# Patient Record
Sex: Male | Born: 1950
Health system: Southern US, Community
[De-identification: ages and names within clinical notes are randomized; demographics above are authoritative.]

## PROBLEM LIST (undated history)

## (undated) DIAGNOSIS — I700 Atherosclerosis of aorta: Secondary | ICD-10-CM

## (undated) DIAGNOSIS — J45909 Unspecified asthma, uncomplicated: Secondary | ICD-10-CM

## (undated) DIAGNOSIS — Z86010 Personal history of colonic polyps: Secondary | ICD-10-CM

## (undated) DIAGNOSIS — H269 Unspecified cataract: Secondary | ICD-10-CM

## (undated) DIAGNOSIS — F32A Depression, unspecified: Secondary | ICD-10-CM

## (undated) DIAGNOSIS — I10 Essential (primary) hypertension: Secondary | ICD-10-CM

## (undated) DIAGNOSIS — E785 Hyperlipidemia, unspecified: Secondary | ICD-10-CM

## (undated) DIAGNOSIS — I2510 Atherosclerotic heart disease of native coronary artery without angina pectoris: Secondary | ICD-10-CM

## (undated) DIAGNOSIS — R7303 Prediabetes: Secondary | ICD-10-CM

## (undated) DIAGNOSIS — M1990 Unspecified osteoarthritis, unspecified site: Secondary | ICD-10-CM

## (undated) DIAGNOSIS — F419 Anxiety disorder, unspecified: Secondary | ICD-10-CM

## (undated) DIAGNOSIS — F1910 Other psychoactive substance abuse, uncomplicated: Secondary | ICD-10-CM

## (undated) DIAGNOSIS — I714 Abdominal aortic aneurysm, without rupture, unspecified: Secondary | ICD-10-CM

## (undated) DIAGNOSIS — L509 Urticaria, unspecified: Secondary | ICD-10-CM

## (undated) DIAGNOSIS — F329 Major depressive disorder, single episode, unspecified: Secondary | ICD-10-CM

## (undated) HISTORY — DX: Abdominal aortic aneurysm, without rupture, unspecified: I71.40

## (undated) HISTORY — PX: COLONOSCOPY: SHX174

## (undated) HISTORY — DX: Prediabetes: R73.03

## (undated) HISTORY — DX: Major depressive disorder, single episode, unspecified: F32.9

## (undated) HISTORY — DX: Personal history of colonic polyps: Z86.010

## (undated) HISTORY — PX: EYE SURGERY: SHX253

## (undated) HISTORY — DX: Unspecified asthma, uncomplicated: J45.909

## (undated) HISTORY — DX: Urticaria, unspecified: L50.9

## (undated) HISTORY — DX: Hyperlipidemia, unspecified: E78.5

## (undated) HISTORY — DX: Depression, unspecified: F32.A

## (undated) HISTORY — DX: Unspecified cataract: H26.9

## (undated) HISTORY — PX: POLYPECTOMY: SHX149

## (undated) HISTORY — DX: Essential (primary) hypertension: I10

## (undated) HISTORY — DX: Atherosclerosis of aorta: I70.0

## (undated) HISTORY — DX: Atherosclerotic heart disease of native coronary artery without angina pectoris: I25.10

## (undated) HISTORY — DX: Anxiety disorder, unspecified: F41.9

## (undated) HISTORY — DX: Unspecified osteoarthritis, unspecified site: M19.90

## (undated) HISTORY — DX: Other psychoactive substance abuse, uncomplicated: F19.10

---

## 1999-07-10 ENCOUNTER — Ambulatory Visit (HOSPITAL_BASED_OUTPATIENT_CLINIC_OR_DEPARTMENT_OTHER): Admission: RE | Admit: 1999-07-10 | Discharge: 1999-07-10 | Payer: Self-pay | Admitting: Plastic Surgery

## 1999-07-10 ENCOUNTER — Encounter (INDEPENDENT_AMBULATORY_CARE_PROVIDER_SITE_OTHER): Payer: Self-pay | Admitting: *Deleted

## 2002-01-26 ENCOUNTER — Emergency Department (HOSPITAL_COMMUNITY): Admission: EM | Admit: 2002-01-26 | Discharge: 2002-01-26 | Payer: Self-pay | Admitting: *Deleted

## 2003-10-10 ENCOUNTER — Encounter (INDEPENDENT_AMBULATORY_CARE_PROVIDER_SITE_OTHER): Payer: Self-pay | Admitting: *Deleted

## 2003-10-10 ENCOUNTER — Ambulatory Visit (HOSPITAL_COMMUNITY): Admission: RE | Admit: 2003-10-10 | Discharge: 2003-10-10 | Payer: Self-pay | Admitting: Surgery

## 2003-10-10 ENCOUNTER — Ambulatory Visit (HOSPITAL_BASED_OUTPATIENT_CLINIC_OR_DEPARTMENT_OTHER): Admission: RE | Admit: 2003-10-10 | Discharge: 2003-10-10 | Payer: Self-pay | Admitting: Surgery

## 2005-02-03 HISTORY — PX: PROSTATE SURGERY: SHX751

## 2008-02-04 HISTORY — PX: CATARACT EXTRACTION W/ INTRAOCULAR LENS  IMPLANT, BILATERAL: SHX1307

## 2008-09-12 ENCOUNTER — Encounter: Payer: Self-pay | Admitting: Internal Medicine

## 2008-12-06 ENCOUNTER — Encounter: Payer: Self-pay | Admitting: Internal Medicine

## 2008-12-06 ENCOUNTER — Ambulatory Visit (HOSPITAL_COMMUNITY): Admission: RE | Admit: 2008-12-06 | Discharge: 2008-12-06 | Payer: Self-pay | Admitting: Internal Medicine

## 2008-12-14 ENCOUNTER — Encounter: Payer: Self-pay | Admitting: Internal Medicine

## 2009-01-03 LAB — HM COLONOSCOPY

## 2009-01-05 ENCOUNTER — Encounter (INDEPENDENT_AMBULATORY_CARE_PROVIDER_SITE_OTHER): Payer: Self-pay | Admitting: *Deleted

## 2009-01-08 ENCOUNTER — Ambulatory Visit: Payer: Self-pay | Admitting: Internal Medicine

## 2009-01-22 ENCOUNTER — Ambulatory Visit: Payer: Self-pay | Admitting: Internal Medicine

## 2009-01-24 ENCOUNTER — Encounter: Payer: Self-pay | Admitting: Internal Medicine

## 2010-03-07 NOTE — Letter (Signed)
Summary: [facility]  [facility]   Imported By: Sherian Rein 02/15/2009 07:53:37  _____________________________________________________________________  External Attachment:    Type:   Image     Comment:   External Document

## 2010-03-07 NOTE — Letter (Signed)
Summary: Dr.William Christen Butter, M.D.  Dr.William Christen Butter, M.D.   Imported By: Sherian Rein 02/15/2009 07:52:28  _____________________________________________________________________  External Attachment:    Type:   Image     Comment:   External Document

## 2010-03-07 NOTE — Procedures (Signed)
Summary: Colonoscopy  Patient: Victor Pham Note: All result statuses are Final unless otherwise noted.  Tests: (1) Colonoscopy (COL)   COL Colonoscopy           DONE     [facility]     [address]     [place], [place]  [address]           COLONOSCOPY PROCEDURE REPORT           PATIENT:  Victor, Pham  MR#:  [id number]     BIRTHDATE:  [birth year], 58 yrs. old  GENDER:  male           ENDOSCOPIST:  Iva Boop, MD, [facility]     Referred by:  Lucky Cowboy, M.D.           PROCEDURE DATE:  01/22/2009     PROCEDURE:  Colonoscopy with biopsy and snare polypectomy     ASA CLASS:  Class II     INDICATIONS:  Routine Risk Screening           MEDICATIONS:   Fentanyl 100 mcg IV, Versed 12 mg IV           DESCRIPTION OF PROCEDURE:   After the risks benefits and     alternatives of the procedure were thoroughly explained, informed     consent was obtained.  Digital rectal exam was performed and     revealed no abnormalities and normal prostate.   The LB [id number]     [id number] endoscope was introduced through the anus and advanced to     the cecum in 4:30 minutes, which was identified by both the     appendix and ileocecal valve, without limitations.  The quality of     the prep was adequate, using MiraLax.  The instrument was then     slowly withdrawn in 19:10 minutes as the colon was fully examined.     <<PROCEDUREIMAGES>>           FINDINGS:  Moderate diverticulosis was found in the sigmoid colon.     A diminutive polyp was found in the cecum. It was 3 mm in size.     The polyp was removed using cold biopsy forceps.  Three polyps     were found in the left colon. They were 5 mm in size. Polyps were     snared without cautery. Retrieval was successful. This was     otherwise a normal examination of the colon.   Retroflexed views     in the rectum revealed no abnormalities.    The scope was then     withdrawn from the patient and the procedure completed.        COMPLICATIONS:  None           ENDOSCOPIC IMPRESSION:     1) Moderate diverticulosis in the sigmoid colon     2) 3 mm diminutive polyp in the cecum, removed     3) Three 5 mm  polyps in the left colon, removed     4) Otherwise normal examination, adequate prep           REPEAT EXAM:  In for Colonoscopy, pending biopsy results.           Iva Boop, MD, [facility]           CC:  The Patient     Lucky Cowboy, MD           n.  eSIGNED:   Iva Boop at 01/22/2009 11:21 AM           Georgiann Cocker, [id number]  Note: An exclamation mark (!) indicates a result that was not dispersed into the flowsheet. Document Creation Date: 01/22/2009 11:21 AM _______________________________________________________________________  (1) Order result status: Final Collection or observation date-time: 01/22/2009 11:09 Requested date-time:  Receipt date-time:  Reported date-time:  Referring Physician:   Ordering Physician: Stan Head [phone]) Specimen Source:  Source: Launa Grill Order Number: [id number] Lab site:   Appended Document: Colonoscopy     Procedures Next Due Date:    Colonoscopy: 02/2012

## 2010-03-07 NOTE — Letter (Signed)
Summary: Patient Notice- Polyp Results  [facility]  [address] [place], [place] [address]   Phone: [phone]  Fax: [phone]        January 24, 2009 MRN: [id number]    [facility] [address] [place], [place]  [address]    Dear Mr. KUROWSKI,  The polyps removed from your colon were adenomatous. This means that they were pre-cancerous or that  they had the potential to change into cancer over time.   I recommend that you have a repeat colonoscopy in 3 years to determine if you have developed any new polyps over time. If you develop any new rectal bleeding, abdominal pain or significant bowel habit changes, please contact us before then.  Please call us if you are having persistent problems or have questions about your condition that have not been fully answered at this time.   Sincerely,  Iva Boop MD, [facility]  This letter has been electronically signed by your physician.  Appended Document: Patient Notice- Polyp Results Letter mailed 12.23.10

## 2010-03-07 NOTE — Miscellaneous (Signed)
Summary: [facility] Previsit/prep  Clinical Lists Changes  Medications: Added new medication of DULCOLAX 5 MG  TBEC (BISACODYL) Day before procedure take 2 at 3pm and 2 at 8pm. - Signed Added new medication of METOCLOPRAMIDE HCL 10 MG  TABS (METOCLOPRAMIDE HCL) As per prep instructions. - Signed Added new medication of MIRALAX   POWD (POLYETHYLENE GLYCOL 3350) As per prep  instructions. - Signed Rx of DULCOLAX 5 MG  TBEC (BISACODYL) Day before procedure take 2 at 3pm and 2 at 8pm.;  #4 x 0;  Signed;  Entered by: Wyona Almas RN;  Authorized by: Iva Boop MD, [facility];  Method used: [facility] [phone][facility] [address] [place], [place], [place]  [address], Ph: [phone] or [phone], Fax: [phone] Rx of METOCLOPRAMIDE HCL 10 MG  TABS (METOCLOPRAMIDE HCL) As per prep instructions.;  #2 x 0;  Signed;  Entered by: Wyona Almas RN;  Authorized by: Iva Boop MD, [facility];  Method used: [facility]ically [facility]  [address] [phone]*, [address] [place], [place], [place]  [address], Ph: [phone] or [phone], Fax: [phone] Rx of MIRALAX   POWD (POLYETHYLENE GLYCOL 3350) As per prep  instructions.;  #255gm x 0;  Signed;  Entered by: Wyona Almas RN;  Authorized by: Iva Boop MD, [facility];  Method used: [facility]ically [facility]  [phone]*, [address] [place], [place], [place]  [address], Ph: [phone] or [phone], Fax: [phone] Allergies: Added new allergy or adverse reaction of PENICILLIN Observations: Added new observation of NKA: F (01/08/2009 15:46)    Prescriptions: MIRALAX   POWD (POLYETHYLENE GLYCOL 3350) As per prep  instructions.  #255gm x 0   Entered by:   Wyona Almas RN   Authorized by:   Iva Boop MD, [facility]   Signed by:   Wyona Almas RN on 01/08/2009   Method used:   [facility] to        [facility]  [address] [phone]* (retail)       [address] [place], [place]  [address]       Ph: [phone] or [phone]       Fax:  [phone]   RxID:   [id number] METOCLOPRAMIDE HCL 10 MG  TABS (METOCLOPRAMIDE HCL) As per prep instructions.  #2 x 0   Entered by:   Wyona Almas RN   Authorized by:   Iva Boop MD, [facility]   Signed by:   Wyona Almas RN on 01/08/2009   Method used:   [facility]ronically to        [facility]  [address] [phone]* (retail)       [address]       [place], [place]  [address]       Ph: [phone] or [phone]       Fax: [phone]   RxID:   [id number] DULCOLAX 5 MG  TBEC (BISACODYL) Day before procedure take 2 at 3pm and 2 at 8pm.  #4 x 0   Entered by:   Wyona Almas RN   Authorized by:   Iva Boop MD, [facility]   Signed by:   Wyona Almas RN on 01/08/2009   Method used:   [facility]        [facility]  [phone])       [address]  [place], [place]  [address]       Ph: [phone] or [phone]       Fax: [phone]   RxID:   [id number]

## 2010-03-07 NOTE — Letter (Signed)
Summary: [facility] Instructions  Victor Pham  [address] [place], [place] [address]   Phone: [phone]  Fax: [phone]       Victor Pham    1951-01-12    MRN: [id number]       Procedure Day Dorna Bloom: Sheral Flow. 01/22/09     Arrival Time: 10:00am     Procedure Time: 11:00am     Location of Procedure:                    _ X_  [facility] (4th Floor)    PREPARATION FOR COLONOSCOPY WITH MIRALAX  Starting 5 days prior to your procedure  12/15 do not eat nuts, seeds, popcorn, corn, beans, peas,  salads, or any raw vegetables.  Do not take any fiber supplements (e.g. Metamucil, Citrucel, and Benefiber). ____________________________________________________________________________________________________   THE DAY BEFORE YOUR PROCEDURE         DATE: 12/19 DAY:  [name].  1   Drink clear liquids the entire day-NO SOLID FOOD  2   Do not drink anything colored red or purple.  Avoid juices with pulp.  No orange juice.  3   Drink at least 64 oz. (8 glasses) of fluid/clear liquids during the day to prevent dehydration and help the prep work efficiently.  CLEAR LIQUIDS INCLUDE: Water Jello Ice Popsicles Tea (sugar ok, no milk/cream) Powdered fruit flavored drinks Coffee (sugar ok, no milk/cream) Gatorade Juice: apple, Funches grape, Alessandrini cranberry  Lemonade Clear bullion, consomm, broth Carbonated beverages (any kind) Strained chicken noodle soup Hard Candy  4   Mix the entire bottle of Miralax with 64 oz. of Gatorade/Powerade in the morning and put in the refrigerator to chill.  5   At 3:00 pm take 2 Dulcolax/Bisacodyl tablets.  6   At 4:30 pm take one Reglan/Metoclopramide tablet.  7  Starting at 5:00 pm drink one 8 oz glass of the Miralax mixture every 15-20 minutes until you have finished drinking the entire 64 oz.  You should finish drinking prep around 7:30 or 8:00 pm.  8   If you are nauseated, you may take the 2nd Reglan/Metoclopramide tablet at 6:30  pm.        9    At 8:00 pm take 2 more DULCOLAX/Bisacodyl tablets.     THE DAY OF YOUR PROCEDURE      DATE:  12/20 DAY: Mon.  You may drink clear liquids until 9:00am  (2 HOURS BEFORE PROCEDURE).   MEDICATION INSTRUCTIONS  Unless otherwise instructed, you should take regular prescription medications with a small sip of water as early as possible the morning of your procedure.        OTHER INSTRUCTIONS  You will need a responsible adult at least 58 years of age to accompany you and drive you home.   This person must remain in the waiting room during your procedure.  Wear loose fitting clothing that is easily removed.  Leave jewelry and other valuables at home.  However, you may wish to bring a book to read or an iPod/MP3 player to listen to music as you wait for your procedure to start.  Remove all body piercing jewelry and leave at home.  Total time from sign-in until discharge is approximately 2-3 hours.  You should go home directly after your procedure and rest.  You can resume normal activities the day after your procedure.  The day of your procedure you should not:   Drive   Make legal decisions  Operate machinery   Drink alcohol   Return to work  You will receive specific instructions about eating, activities and medications before you leave.   The above instructions have been reviewed and explained to me by   Wyona Almas RN  January 08, 2009 4:54 PM     I fully understand and can verbalize these instructions _____________________________ Date _______

## 2010-06-21 NOTE — Op Note (Signed)
NAME:  Victor Pham, Victor Pham NO.:  [id number]   MEDICAL RECORD NO.:  [id number]                   PATIENT TYPE:  AMB   LOCATION:  [facility]                                 FACILITY:  [facility]   PHYSICIAN:  Thornton Park. Daphine Deutscher, M.D.             DATE OF BIRTH:  [birth year]   DATE OF PROCEDURE:  10/10/2003  DATE OF DISCHARGE:                                 OPERATIVE REPORT   PREOPERATIVE DIAGNOSIS:  A 5 cm mass right posterior neck.   POSTOPERATIVE DIAGNOSIS:  Probable lipoma right posterior neck.   PROCEDURE:  Excision of mass 5 cm from neck.   DESCRIPTION OF PROCEDURE:  [name] is a 60 year old gentleman who has  this increasing mass in his right posterior neck.  Sometimes it is sore and  it is getting more prominent.  Informed consent was obtained regarding its  removal with complications not limited to numbness, nerve pain or  recurrence.  Mr. Revak was given MAC anesthesia and then the neck was  prepped with Betadine and draped sterilely.  Preoperatively, I had marked a  proposed incision line.  This area was injected with a mixture of 0.5%  Marcaine with epinephrine and newt.  The incision was made and I carried it  down to into a capsule.  I used this and then dissected out a 5 x 4.5 cm  lipoma that had multiple pods but I was able to dissect each one out until  it appeared that the mass was excised in toto.  Bleeding was controlled with  the electrocautery although it was minimal.   The wound was then closed in layers, beginning with 3-0 nylon in the very  thick dermis and once the dermis was nicely approximated, it was closed with  vertical mattress sutures of 4-0 nylon.  The patient seemed to tolerate the  procedure well.  He will be given some Vicodin if needed for pain and he  will be followed up in the office for suture removal in about a week.                                               Thornton Park Daphine Deutscher, M.D.    MBM/MEDQ  D:  10/10/2003  T:   10/10/2003  Job:  [id number]   cc:   Lucky Cowboy, M.D.  [address]  [place], [place] [address]  Fax: [phone]

## 2011-12-28 ENCOUNTER — Encounter: Payer: Self-pay | Admitting: Internal Medicine

## 2011-12-28 DIAGNOSIS — Z860100 Personal history of colon polyps, unspecified: Secondary | ICD-10-CM

## 2011-12-28 HISTORY — DX: Personal history of colon polyps, unspecified: Z86.0100

## 2011-12-28 HISTORY — DX: Personal history of colonic polyps: Z86.010

## 2011-12-29 ENCOUNTER — Encounter: Payer: Self-pay | Admitting: Internal Medicine

## 2012-09-29 ENCOUNTER — Encounter: Payer: Self-pay | Admitting: Internal Medicine

## 2012-10-22 ENCOUNTER — Encounter: Payer: Self-pay | Admitting: Internal Medicine

## 2012-12-06 ENCOUNTER — Other Ambulatory Visit: Payer: Self-pay | Admitting: Internal Medicine

## 2012-12-06 LAB — HEPATIC FUNCTION PANEL
ALT: 17 U/L (ref 0–53)
AST: 16 U/L (ref 0–37)
Albumin: 4.1 g/dL (ref 3.5–5.2)
Alkaline Phosphatase: 67 U/L (ref 39–117)
Bilirubin, Direct: 0.1 mg/dL (ref 0.0–0.3)
Indirect Bilirubin: 0.3 mg/dL (ref 0.0–0.9)
Total Bilirubin: 0.4 mg/dL (ref 0.3–1.2)
Total Protein: 6.9 g/dL (ref 6.0–8.3)

## 2012-12-06 LAB — LIPID PANEL
Cholesterol: 180 mg/dL (ref 0–200)
HDL: 64 mg/dL (ref >39)
LDL Cholesterol: 101 mg/dL — ABNORMAL HIGH (ref 0–99)
Total CHOL/HDL Ratio: 2.8 Ratio
Triglycerides: 75 mg/dL (ref <150)
VLDL: 15 mg/dL (ref 0–40)

## 2012-12-06 LAB — CBC WITH DIFFERENTIAL/PLATELET
Basophils Absolute: 0 10*3/uL (ref 0.0–0.1)
Basophils Relative: 0 % (ref 0–1)
Eosinophils Absolute: 0.1 10*3/uL (ref 0.0–0.7)
Eosinophils Relative: 1 % (ref 0–5)
HCT: 44.6 % (ref 39.0–52.0)
Hemoglobin: 15.3 g/dL (ref 13.0–17.0)
Lymphocytes Relative: 33 % (ref 12–46)
Lymphs Abs: 2.2 10*3/uL (ref 0.7–4.0)
MCH: 32.7 pg (ref 26.0–34.0)
MCHC: 34.3 g/dL (ref 30.0–36.0)
MCV: 95.3 fL (ref 78.0–100.0)
Monocytes Absolute: 0.7 10*3/uL (ref 0.1–1.0)
Monocytes Relative: 11 % (ref 3–12)
Neutro Abs: 3.5 10*3/uL (ref 1.7–7.7)
Neutrophils Relative %: 55 % (ref 43–77)
Platelets: 217 10*3/uL (ref 150–400)
RBC: 4.68 MIL/uL (ref 4.22–5.81)
RDW: 14.2 % (ref 11.5–15.5)
WBC: 6.5 10*3/uL (ref 4.0–10.5)

## 2012-12-06 LAB — BASIC METABOLIC PANEL WITH GFR
BUN: 8 mg/dL (ref 6–23)
CO2: 27 mEq/L (ref 19–32)
Calcium: 9.5 mg/dL (ref 8.4–10.5)
Chloride: 105 mEq/L (ref 96–112)
Creat: 0.69 mg/dL (ref 0.50–1.35)
GFR, Est African American: >89 mL/min
GFR, Est Non African American: >89 mL/min
Glucose, Bld: 82 mg/dL (ref 70–99)
Potassium: 4.3 mEq/L (ref 3.5–5.3)
Sodium: 140 mEq/L (ref 135–145)

## 2012-12-06 LAB — MAGNESIUM: Magnesium: 2 mg/dL (ref 1.5–2.5)

## 2012-12-07 LAB — HEPATITIS B CORE ANTIBODY, TOTAL: Hep B Core Total Ab: NONREACTIVE

## 2012-12-07 LAB — URINALYSIS, ROUTINE W REFLEX MICROSCOPIC
Bilirubin Urine: NEGATIVE
Glucose, UA: NEGATIVE mg/dL
Hgb urine dipstick: NEGATIVE
Ketones, ur: NEGATIVE mg/dL
Leukocytes, UA: NEGATIVE
Nitrite: NEGATIVE
Protein, ur: NEGATIVE mg/dL
Specific Gravity, Urine: 1.01 (ref 1.005–1.030)
Urobilinogen, UA: 0.2 mg/dL (ref 0.0–1.0)
pH: 6.5 (ref 5.0–8.0)

## 2012-12-07 LAB — HEMOGLOBIN A1C
Hgb A1c MFr Bld: 5.8 % — ABNORMAL HIGH (ref <5.7)
Mean Plasma Glucose: 120 mg/dL — ABNORMAL HIGH (ref <117)

## 2012-12-07 LAB — VITAMIN B12: Vitamin B-12: 253 pg/mL (ref 211–911)

## 2012-12-07 LAB — TSH: TSH: 1.328 u[IU]/mL (ref 0.350–4.500)

## 2012-12-07 LAB — HIV ANTIBODY (ROUTINE TESTING W REFLEX): HIV: NONREACTIVE

## 2012-12-07 LAB — TESTOSTERONE: Testosterone: 538 ng/dL (ref 300–890)

## 2012-12-07 LAB — HEPATITIS A ANTIBODY, TOTAL: Hep A Total Ab: NONREACTIVE

## 2012-12-07 LAB — HEPATITIS C ANTIBODY: HCV Ab: NEGATIVE

## 2012-12-07 LAB — PSA: PSA: 1.28 ng/mL (ref <=4.00)

## 2012-12-07 LAB — VITAMIN D 25 HYDROXY (VIT D DEFICIENCY, FRACTURES): Vit D, 25-Hydroxy: 36 ng/mL (ref 30–89)

## 2012-12-07 LAB — INSULIN, RANDOM: Insulin: 13 u[IU]/mL (ref 3–28)

## 2012-12-09 LAB — HEPATITIS B E ANTIBODY: Hepatitis Be Antibody: NEGATIVE

## 2012-12-10 ENCOUNTER — Telehealth: Payer: Self-pay | Admitting: *Deleted

## 2012-12-10 ENCOUNTER — Other Ambulatory Visit: Payer: Self-pay | Admitting: Internal Medicine

## 2012-12-10 ENCOUNTER — Encounter: Payer: Self-pay | Admitting: Internal Medicine

## 2012-12-10 DIAGNOSIS — I10 Essential (primary) hypertension: Secondary | ICD-10-CM

## 2012-12-10 NOTE — Telephone Encounter (Signed)
Message copied by Reggy Eye on Fri Dec 10, 2012  3:07 PM ------      Message from: Lucky Cowboy      Created: Thu Dec 09, 2012 12:24 AM       Vitamin D 36 extremely low recc 10,000 units every day ------

## 2012-12-10 NOTE — Telephone Encounter (Signed)
Spoke with wife about results.

## 2012-12-10 NOTE — Progress Notes (Signed)
Pt aware.

## 2012-12-16 ENCOUNTER — Ambulatory Visit (AMBULATORY_SURGERY_CENTER): Payer: Self-pay

## 2012-12-16 VITALS — Ht 76.0 in | Wt 220.0 lb

## 2012-12-16 DIAGNOSIS — Z860100 Personal history of colon polyps, unspecified: Secondary | ICD-10-CM

## 2012-12-16 MED ORDER — SUPREP BOWEL PREP KIT 17.5-3.13-1.6 GM/177ML PO SOLN: 1.0000 | Freq: Once | ORAL | Status: DC

## 2012-12-20 ENCOUNTER — Encounter: Payer: Self-pay | Admitting: Internal Medicine

## 2013-01-03 ENCOUNTER — Encounter: Payer: Self-pay | Admitting: Internal Medicine

## 2013-01-03 ENCOUNTER — Ambulatory Visit (AMBULATORY_SURGERY_CENTER): Payer: 59 | Admitting: Internal Medicine

## 2013-01-03 VITALS — BP 147/84 | HR 87 | Temp 99.2°F | Resp 16 | Ht 76.0 in | Wt 220.0 lb

## 2013-01-03 DIAGNOSIS — K648 Other hemorrhoids: Secondary | ICD-10-CM

## 2013-01-03 DIAGNOSIS — D126 Benign neoplasm of colon, unspecified: Secondary | ICD-10-CM

## 2013-01-03 DIAGNOSIS — Z86010 Personal history of colonic polyps: Secondary | ICD-10-CM

## 2013-01-03 MED ORDER — SODIUM CHLORIDE 0.9 % IV SOLN: 500.0000 mL | INTRAVENOUS | Status: DC

## 2013-01-03 MED ORDER — FLEET ENEMA 7-19 GM/118ML RE ENEM
1.0000 | ENEMA | Freq: Once | RECTAL | Status: AC
  Administered 2013-01-03: 1 via RECTAL

## 2013-01-03 NOTE — Progress Notes (Signed)
Called to room to assist during endoscopic procedure.  Patient ID and intended procedure confirmed with present staff. Received instructions for my participation in the procedure from the performing physician.  

## 2013-01-03 NOTE — Op Note (Signed)
[facility] [address] [place], [address]   COLONOSCOPY PROCEDURE REPORT  PATIENT: Victor Pham, Victor Pham  MR#: [id number] BIRTHDATE: Jan 12, 1951 , 62  yrs. old GENDER: Male ENDOSCOPIST: Iva Boop, MD, [facility] PROCEDURE DATE:  01/03/2013 PROCEDURE:   Colonoscopy with biopsy First Screening Colonoscopy - Avg.  risk and is 50 yrs.  old or older - No.  Prior Negative Screening - Now for repeat screening. N/A  History of Adenoma - Now for follow-up colonoscopy & has been > or = to 3 yrs.  Yes hx of adenoma.  Has been 3 or more years since last colonoscopy.  Polyps Removed Today? Yes. ASA CLASS:   Class II INDICATIONS:Patient's personal history of adenomatous colon polyps.  MEDICATIONS: propofol (Diprivan) 450mg  IV  DESCRIPTION OF PROCEDURE:   After the risks benefits and alternatives of the procedure were thoroughly explained, informed consent was obtained.  A digital rectal exam revealed an enlarged prostate - mildly enlarged prostate right lobe.   The LB [id number]  endoscope was introduced through the anus and advanced to the cecum, which was identified by both the appendix and ileocecal valve. No adverse events experienced.   The quality of the prep was Suprep good  The instrument was then slowly withdrawn as the colon was fully examined.   COLON FINDINGS: Two diminutive sessile polyps were found in the ascending colon.  A polypectomy was performed with cold forceps. The resection was complete and the polyp tissue was completely retrieved.   Severe diverticulosis was noted in the sigmoid colon. The colon mucosa was otherwise normal.  Retroflexed views revealed internal hemorrhoids. The time to cecum=2 minutes 42 seconds. Withdrawal time=12 minutes 19 seconds.  The scope was withdrawn and the procedure completed. COMPLICATIONS: There were no complications.  ENDOSCOPIC IMPRESSION: 1.   Two diminutive sessile polyps were found in the ascending colon;  polypectomy was performed with cold forceps 2.   Severe diverticulosis was noted in the sigmoid colon 3.   The colon mucosa was otherwise normal - good prep  RECOMMENDATIONS: Timing of repeat colonoscopy will be determined by pathology findings in a patient w/ hx 3 diminutive adenomas 2010   eSigned:  Iva Boop, MD, [facility] 01/03/2013 10:50 AM   cc: The Patient and Lucky Cowboy, MD

## 2013-01-03 NOTE — Progress Notes (Signed)
Per the pt the last result of the prep was brownish colored liquid, no formed stool.  Dr. Leone Payor was advised.  He said to administer fleet enema x1.  Pt instruruted on how to administer the fleet.  He states he understands. Maw

## 2013-01-03 NOTE — Progress Notes (Signed)
Patient did not experience any of the following events: a burn prior to discharge; a fall within the facility; wrong site/side/patient/procedure/implant event; or a hospital transfer or hospital admission upon discharge from the facility. (G8907) Patient did not have preoperative order for IV antibiotic SSI prophylaxis. (G8918)  

## 2013-01-03 NOTE — Progress Notes (Signed)
Darlyn Read, RN looked at last result, clear golden color. Maw

## 2013-01-03 NOTE — Patient Instructions (Addendum)
I found and removed 2 small polyps that look benign.   You also have a condition called diverticulosis - common and not usually a problem. Please read the handout provided. I also found hemorrhoids.  If you have hemorrhoid problems (swelling, itching, bleeding) I am able to treat those with an in-office procedure. If you like, please call my office at [phone] to schedule an appointment and I can evaluate you further.  It is the time of year to have a vaccination to prevent the flu (influenza virus). Please have this done through your primary care provider or you can get this done at local pharmacies or the [facility]. It would be very helpful if you notify your primary care provider when and where you had the vaccination given by messaging them in My Chart, leaving a message or faxing the information.  I appreciate the opportunity to care for you. Iva Boop, MD, FACG    YOU HAD AN ENDOSCOPIC PROCEDURE TODAY AT THE [facility]: Refer to the procedure report that was given to you for any specific questions about what was found during the examination.  If the procedure report does not answer your questions, please call your gastroenterologist to clarify.  If you requested that your care partner not be given the details of your procedure findings, then the procedure report has been included in a sealed envelope for you to review at your convenience later.  YOU SHOULD EXPECT: Some feelings of bloating in the abdomen. Passage of more gas than usual.  Walking can help get rid of the air that was put into your GI tract during the procedure and reduce the bloating. If you had a lower endoscopy (such as a colonoscopy or flexible sigmoidoscopy) you may notice spotting of blood in your stool or on the toilet paper. If you underwent a bowel prep for your procedure, then you may not have a normal bowel movement for a few days.  DIET: Your first meal following the procedure should be a  light meal and then it is ok to progress to your normal diet.  A half-sandwich or bowl of soup is an example of a good first meal.  Heavy or fried foods are harder to digest and may make you feel nauseous or bloated.  Likewise meals heavy in dairy and vegetables can cause extra gas to form and this can also increase the bloating.  Drink plenty of fluids but you should avoid alcoholic beverages for 24 hours.  ACTIVITY: Your care partner should take you home directly after the procedure.  You should plan to take it easy, moving slowly for the rest of the day.  You can resume normal activity the day after the procedure however you should NOT DRIVE or use heavy machinery for 24 hours (because of the sedation medicines used during the test).    SYMPTOMS TO REPORT IMMEDIATELY: A gastroenterologist can be reached at any hour.  During normal business hours, 8:30 AM to 5:00 PM Monday through Friday, call [phone].  After hours and on weekends, please call the GI answering service at [phone] who will take a message and have the physician on call contact you.   Following lower endoscopy (colonoscopy or flexible sigmoidoscopy):  Excessive amounts of blood in the stool  Significant tenderness or worsening of abdominal pains  Swelling of the abdomen that is new, acute  Fever of 100F or higher    FOLLOW UP: If any biopsies were taken you will be  contacted by phone or by letter within the next 1-3 weeks.  Call your gastroenterologist if you have not heard about the biopsies in 3 weeks.  Our staff will call the home number listed on your records the next business day following your procedure to check on you and address any questions or concerns that you may have at that time regarding the information given to you following your procedure. This is a courtesy call and so if there is no answer at the home number and we have not heard from you through the emergency physician on call, we will assume that  you have returned to your regular daily activities without incident.  SIGNATURES/CONFIDENTIALITY: You and/or your care partner have signed paperwork which will be entered into your electronic medical record.  These signatures attest to the fact that that the information above on your After Visit Summary has been reviewed and is understood.  Full responsibility of the confidentiality of this discharge information lies with you and/or your care-partner.  Polyp, divertiulosis and hemorrhoid information given.

## 2013-01-04 ENCOUNTER — Telehealth: Payer: Self-pay | Admitting: *Deleted

## 2013-01-04 NOTE — Telephone Encounter (Signed)
Message left

## 2013-01-07 ENCOUNTER — Encounter: Payer: Self-pay | Admitting: Internal Medicine

## 2013-01-07 NOTE — Progress Notes (Signed)
Quick Note:  Diminutive adenoma and lipoma Repeat colonoscopy 2019 ______

## 2013-02-28 ENCOUNTER — Other Ambulatory Visit: Payer: Self-pay | Admitting: Physician Assistant

## 2013-02-28 DIAGNOSIS — R059 Cough, unspecified: Secondary | ICD-10-CM

## 2013-02-28 DIAGNOSIS — R05 Cough: Secondary | ICD-10-CM

## 2013-02-28 MED ORDER — AZITHROMYCIN 250 MG PO TABS: ORAL_TABLET | ORAL | Status: AC

## 2013-03-01 ENCOUNTER — Ambulatory Visit (HOSPITAL_COMMUNITY)
Admission: RE | Admit: 2013-03-01 | Discharge: 2013-03-01 | Disposition: A | Discharge status: Home / Self Care | Payer: 59 | Source: Ambulatory Visit | Attending: Physician Assistant | Admitting: Physician Assistant

## 2013-03-01 ENCOUNTER — Ambulatory Visit (INDEPENDENT_AMBULATORY_CARE_PROVIDER_SITE_OTHER): Payer: 59 | Admitting: Emergency Medicine

## 2013-03-01 ENCOUNTER — Encounter: Payer: Self-pay | Admitting: Emergency Medicine

## 2013-03-01 VITALS — BP 132/92 | HR 68 | Temp 98.4°F | Resp 18 | Ht 74.5 in | Wt 220.0 lb

## 2013-03-01 DIAGNOSIS — E782 Mixed hyperlipidemia: Secondary | ICD-10-CM

## 2013-03-01 DIAGNOSIS — R059 Cough, unspecified: Secondary | ICD-10-CM

## 2013-03-01 DIAGNOSIS — J4489 Other specified chronic obstructive pulmonary disease: Secondary | ICD-10-CM | POA: Diagnosis not present

## 2013-03-01 DIAGNOSIS — R05 Cough: Secondary | ICD-10-CM

## 2013-03-01 DIAGNOSIS — E785 Hyperlipidemia, unspecified: Secondary | ICD-10-CM

## 2013-03-01 DIAGNOSIS — R0602 Shortness of breath: Secondary | ICD-10-CM | POA: Insufficient documentation

## 2013-03-01 DIAGNOSIS — E559 Vitamin D deficiency, unspecified: Secondary | ICD-10-CM

## 2013-03-01 DIAGNOSIS — R030 Elevated blood-pressure reading, without diagnosis of hypertension: Secondary | ICD-10-CM

## 2013-03-01 DIAGNOSIS — Z87891 Personal history of nicotine dependence: Secondary | ICD-10-CM | POA: Insufficient documentation

## 2013-03-01 DIAGNOSIS — R5381 Other malaise: Secondary | ICD-10-CM

## 2013-03-01 DIAGNOSIS — J449 Chronic obstructive pulmonary disease, unspecified: Secondary | ICD-10-CM | POA: Insufficient documentation

## 2013-03-01 DIAGNOSIS — R7309 Other abnormal glucose: Secondary | ICD-10-CM

## 2013-03-01 DIAGNOSIS — I10 Essential (primary) hypertension: Secondary | ICD-10-CM | POA: Insufficient documentation

## 2013-03-01 DIAGNOSIS — F329 Major depressive disorder, single episode, unspecified: Secondary | ICD-10-CM

## 2013-03-01 DIAGNOSIS — R7303 Prediabetes: Secondary | ICD-10-CM

## 2013-03-01 DIAGNOSIS — R5383 Other fatigue: Secondary | ICD-10-CM | POA: Insufficient documentation

## 2013-03-01 DIAGNOSIS — F419 Anxiety disorder, unspecified: Secondary | ICD-10-CM

## 2013-03-01 DIAGNOSIS — F32A Depression, unspecified: Secondary | ICD-10-CM

## 2013-03-01 LAB — BASIC METABOLIC PANEL WITH GFR
BUN: 8 mg/dL (ref 6–23)
CO2: 27 mEq/L (ref 19–32)
Calcium: 10 mg/dL (ref 8.4–10.5)
Chloride: 100 mEq/L (ref 96–112)
Creat: 0.69 mg/dL (ref 0.50–1.35)
GFR, Est African American: >89 mL/min
GFR, Est Non African American: >89 mL/min
Glucose, Bld: 98 mg/dL (ref 70–99)
Potassium: 4.6 mEq/L (ref 3.5–5.3)
Sodium: 140 mEq/L (ref 135–145)

## 2013-03-01 LAB — LIPID PANEL
Cholesterol: 228 mg/dL — ABNORMAL HIGH (ref 0–200)
HDL: 54 mg/dL (ref >39)
LDL Cholesterol: 145 mg/dL — ABNORMAL HIGH (ref 0–99)
Total CHOL/HDL Ratio: 4.2 Ratio
Triglycerides: 143 mg/dL (ref <150)
VLDL: 29 mg/dL (ref 0–40)

## 2013-03-01 LAB — CBC WITH DIFFERENTIAL/PLATELET
Basophils Absolute: 0 10*3/uL (ref 0.0–0.1)
Basophils Relative: 1 % (ref 0–1)
Eosinophils Absolute: 0.1 10*3/uL (ref 0.0–0.7)
Eosinophils Relative: 2 % (ref 0–5)
HCT: 46.1 % (ref 39.0–52.0)
Hemoglobin: 15.7 g/dL (ref 13.0–17.0)
Lymphocytes Relative: 33 % (ref 12–46)
Lymphs Abs: 2.1 10*3/uL (ref 0.7–4.0)
MCH: 32.2 pg (ref 26.0–34.0)
MCHC: 34.1 g/dL (ref 30.0–36.0)
MCV: 94.5 fL (ref 78.0–100.0)
Monocytes Absolute: 0.6 10*3/uL (ref 0.1–1.0)
Monocytes Relative: 10 % (ref 3–12)
Neutro Abs: 3.4 10*3/uL (ref 1.7–7.7)
Neutrophils Relative %: 54 % (ref 43–77)
Platelets: 343 10*3/uL (ref 150–400)
RBC: 4.88 MIL/uL (ref 4.22–5.81)
RDW: 13.8 % (ref 11.5–15.5)
WBC: 6.3 10*3/uL (ref 4.0–10.5)

## 2013-03-01 LAB — HEMOGLOBIN A1C
Hgb A1c MFr Bld: 5.9 % — ABNORMAL HIGH (ref <5.7)
Mean Plasma Glucose: 123 mg/dL — ABNORMAL HIGH (ref <117)

## 2013-03-01 LAB — HEPATIC FUNCTION PANEL
ALT: 17 U/L (ref 0–53)
AST: 15 U/L (ref 0–37)
Albumin: 4.4 g/dL (ref 3.5–5.2)
Alkaline Phosphatase: 82 U/L (ref 39–117)
Bilirubin, Direct: 0.1 mg/dL (ref 0.0–0.3)
Indirect Bilirubin: 0.3 mg/dL (ref 0.0–0.9)
Total Bilirubin: 0.4 mg/dL (ref 0.3–1.2)
Total Protein: 7.5 g/dL (ref 6.0–8.3)

## 2013-03-01 LAB — TSH: TSH: 0.804 u[IU]/mL (ref 0.350–4.500)

## 2013-03-01 MED ORDER — PREDNISONE 10 MG PO TABS: ORAL_TABLET | ORAL | Status: DC

## 2013-03-01 NOTE — Patient Instructions (Signed)
Chronic Obstructive Pulmonary Disease New per XRAY- Take mucinex 400mg  with lots of water Chronic obstructive pulmonary disease (COPD) is a common lung problem. In COPD, the flow of air from the lungs is limited. The way your lungs work will probably never return to normal, but there are things you can do to improve you lungs and make yourself feel better. HOME CARE  Take all medicines as told by your doctor.  Only take over-the-counter or prescription medicines as told by your doctor.  Avoid medicines or cough syrups that dry up your airway (such as antihistamines) and do not allow you to get rid of thick spit. You do not need to avoid them if told differently by your doctor.  If you smoke, stop. Smoking makes the problem worse.  Avoid being around things that make your breathing worse (like smoke, chemicals, and fumes).  Use oxygen therapy and therapy to help improve your lungs (pulmonary rehabilitation) if told by your doctor. If you need home oxygen therapy, ask your doctor if you should buy a tool to measure your oxygen level (oximeter).  Avoid people who have a sickness you can catch (contagious).  Avoid going outside when it is very hot, cold, or humid.  Eat healthy foods. Eat smaller meals more often. Rest before meals.  Stay active, but remember to also rest.  Make sure to get all the shots (vaccines) your doctor recommends. Ask your doctor if you need a pneumonia shot.  Learn and use tips on how to relax.  Learn and use tips on how to control your breathing as told by your doctor. Try:  Breathing in (inhaling) through your nose for 1 second. Then, pucker your lips and breath out (exhale) through your lips for 2 seconds.  Putting one hand on your belly (abdomen). Breathe in slowly through your nose for 1 second. Your hand on your belly should move out. Pucker your lips and breathe out slowly through your lips. Your hand on your belly should move in as you breathe  out.  Learn and use controlled coughing to clear thick spit from your lungs. 1. Lean your head a little forward. 2. Breathe in deeply. 3. Try to hold your breath for 3 seconds. 4. Keep your mouth slightly open while coughing 2 times. 5. Spit any thick spit out into a tissue. 6. Rest and do the steps again 1 or 2 times as needed. GET HELP IF:  You cough up more thick spit than usual.  There is a change in the color or thickness of the spit.  It is harder to breathe than usual.  Your breathing is faster than usual. GET HELP RIGHT AWAY IF:   You have shortness of breath while resting.  You have shortness of breath that stops you from:  Being able to talk.  Doing normal activities.  You chest hurts for longer than 5 minutes.  Your skin color is more blue than usual.  Your pulse oximeter shows that you have low oxygen for longer than 5 minutes. MAKE SURE YOU:   Understand these instructions.  Will watch your condition.  Will get help right away if you are not doing well or get worse. Document Released: 07/09/2007 Document Revised: 11/10/2012 Document Reviewed: 09/16/2012 [facility] Patient Information 2014 [place], [place]. Smoking Cessation, Tips for Success If you are ready to quit smoking, congratulations! You have chosen to help yourself be healthier. Cigarettes bring nicotine, tar, carbon monoxide, and other irritants into your body. Your lungs, heart, and  blood vessels will be able to work better without these poisons. There are many different ways to quit smoking. Nicotine gum, nicotine patches, a nicotine inhaler, or nicotine nasal spray can help with physical craving. Hypnosis, support groups, and medicines help break the habit of smoking. WHAT THINGS CAN I DO TO MAKE QUITTING EASIER?  Here are some tips to help you quit for good:  Pick a date when you will quit smoking completely. Tell all of your friends and family about your plan to quit on that date.  Do not try  to slowly cut down on the number of cigarettes you are smoking. Pick a quit date and quit smoking completely starting on that day.  Throw away all cigarettes.   Clean and remove all ashtrays from your home, work, and car.   On a card, write down your reasons for quitting. Carry the card with you and read it when you get the urge to smoke.   Cleanse your body of nicotine. Drink enough water and fluids to keep your urine clear or pale yellow. Do this after quitting to flush the nicotine from your body.   Learn to predict your moods. Do not let a bad situation be your excuse to have a cigarette. Some situations in your life might tempt you into wanting a cigarette.   Never have "just one" cigarette. It leads to wanting another and another. Remind yourself of your decision to quit.   Change habits associated with smoking. If you smoked while driving or when feeling stressed, try other activities to replace smoking. Stand up when drinking your coffee. Brush your teeth after eating. Sit in a different chair when you read the paper. Avoid alcohol while trying to quit, and try to drink fewer caffeinated beverages. Alcohol and caffeine may urge you to smoke.   Avoid foods and drinks that can trigger a desire to smoke, such as sugary or spicy foods and alcohol.   Ask people who smoke not to smoke around you.   Have something planned to do right after eating or having a cup of coffee. For example, plan to take a walk or exercise.   Try a relaxation exercise to calm you down and decrease your stress. Remember, you may be tense and nervous for the first 2 weeks after you quit, but this will pass.   Find new activities to keep your hands busy. Play with a pen, coin, or rubber band. Doodle or draw things on paper.   Brush your teeth right after eating. This will help cut down on the craving for the taste of tobacco after meals. You can also try mouthwash.   Use oral substitutes in place of  cigarettes. Try using lemon drops, carrots, cinnamon sticks, or chewing gum. Keep them handy so they are available when you have the urge to smoke.   When you have the urge to smoke, try deep breathing.   Designate your home as a nonsmoking area.   If you are a heavy smoker, ask your health care provider about a prescription for nicotine chewing gum. It can ease your withdrawal from nicotine.   Reward yourself. Set aside the cigarette money you save and buy yourself something nice.   Look for support from others. Join a support group or smoking cessation program. Ask someone at home or at work to help you with your plan to quit smoking.   Always ask yourself, "Do I need this cigarette or is this just a reflex?" Tell  yourself, "Today, I choose not to smoke," or "I do not want to smoke." You are reminding yourself of your decision to quit.  Do not replace cigarette smoking with electronic cigarettes (commonly called e-cigarettes). The safety of e-cigarettes is unknown, and some may contain harmful chemicals.  If you relapse, do not give up! Plan ahead and think about what you will do the next time you get the urge to smoke.  HOW WILL I FEEL WHEN I QUIT SMOKING? You may have symptoms of withdrawal because your body is used to nicotine (the addictive substance in cigarettes). You may crave cigarettes, be irritable, feel very hungry, cough often, get headaches, or have difficulty concentrating. The withdrawal symptoms are only temporary. They are strongest when you first quit but will go away within 10 14 days. When withdrawal symptoms occur, stay in control. Think about your reasons for quitting. Remind yourself that these are signs that your body is healing and getting used to being without cigarettes. Remember that withdrawal symptoms are easier to treat than the major diseases that smoking can cause.  Even after the withdrawal is over, expect periodic urges to smoke. However, these cravings  are generally short lived and will go away whether you smoke or not. Do not smoke!  WHAT RESOURCES ARE AVAILABLE TO HELP ME QUIT SMOKING? Your health care provider can direct you to community resources or hospitals for support, which may include:  Group support.  Education.  Hypnosis.  Therapy. Document Released: 10/19/2003 Document Revised: 11/10/2012 Document Reviewed: 07/08/2012 [facility] Patient Information 2014 [place], [place]. Pneumonia, Adult Pneumonia is an infection of the lungs. It may be caused by a germ (virus or bacteria). Some types of pneumonia can spread easily from person to person. This can happen when you cough or sneeze. HOME CARE  Only take medicine as told by your doctor.  Take your medicine (antibiotics) as told. Finish it even if you start to feel better.  Do not smoke.  You may use a vaporizer or humidifier in your room. This can help loosen thick spit (mucus).  Sleep so you are almost sitting up (semi-upright). This helps reduce coughing.  Rest. A shot (vaccine) can help prevent pneumonia. Shots are often advised for:  People over 76 years old.  Patients on chemotherapy.  People with long-term (chronic) lung problems.  People with immune system problems. GET HELP RIGHT AWAY IF:   You are getting worse.  You cannot control your cough, and you are losing sleep.  You cough up blood.  Your pain gets worse, even with medicine.  You have a fever.  Any of your problems are getting worse, not better.  You have shortness of breath or chest pain. MAKE SURE YOU:   Understand these instructions.  Will watch your condition.  Will get help right away if you are not doing well or get worse. Document Released: 07/09/2007 Document Revised: 04/14/2011 Document Reviewed: 04/12/2010 [facility] Patient Information 2014 [place].

## 2013-03-01 NOTE — Progress Notes (Signed)
Subjective:    Patient ID: Victor Pham, male    DOB: 05-Mar-1950, 63 y.o.   MRN: [id number]  HPI Comments: 63 yo male presents for 3 month F/U for HTN, Cholesterol, Pre-Dm, D. Deficient. He notes BP good at home He is eating descent and keeps busy for exercise.  He notes Cough over 1 week. Headache over 1 week. He also had an episode of dizziness and almost fell. He started Zpak yesterday without relief. Cough is occasional productive and is always hard. Nyquil no relief x >1week. He has not been drinking enough water. .last  He notes he is staying more tired than usual and thinks may have been happening before recent illness.  Headache  Associated symptoms include coughing and sinus pressure.  Cough Associated symptoms include headaches and postnasal drip.   Current Outpatient Prescriptions on File Prior to Visit  Medication Sig Dispense Refill  . aspirin 81 MG tablet Take 81 mg by mouth daily.      Marland Kitchen azithromycin (ZITHROMAX) 250 MG tablet Take 2 tablets (500 mg) on  Day 1,  followed by 1 tablet (250 mg) once daily on Days 2 through 5.  6 each  1  . cholecalciferol (VITAMIN D) 1000 UNITS tablet Take 4,000 Units by mouth QID.      Marland Kitchen citalopram (CELEXA) 40 MG tablet Take 40 mg by mouth daily.      . clonazePAM (KLONOPIN) 2 MG tablet Take 2 mg by mouth 3 (three) times daily.      . rosuvastatin (CRESTOR) 40 MG tablet Take 20 mg by mouth daily. 1/2 tablet       No current facility-administered medications on file prior to visit.   ALLERGIES Lipitor and Penicillins  Past Medical History  Diagnosis Date  . Personal history of adenomatous colonic polyps 12/28/2011    01/2009 = 3 diminutive adenomas   . Hypertension   . Hyperlipidemia   . Urticaria   . Pre-diabetes   . Cataract     removed  . Depression   . Anxiety       Review of Systems  Constitutional: Positive for fatigue.  HENT: Positive for congestion, postnasal drip and sinus pressure.   Respiratory: Positive for  cough.   Neurological: Positive for headaches.  All other systems reviewed and are negative.   BP 132/92  Pulse 68  Temp(Src) 98.4 F (36.9 C) (Oral)  Resp 18  Ht 6' 2.5" (1.892 m)  Wt 220 lb (99.791 kg)  BMI 27.88 kg/m2     Objective:   Physical Exam  Nursing note and vitals reviewed. Constitutional: He is oriented to person, place, and time. He appears well-developed and well-nourished.  HENT:  Head: Normocephalic and atraumatic.  Right Ear: External ear normal.  Left Ear: External ear normal.  Nose: Nose normal.  Yellow TMs bilateral   Eyes: Conjunctivae and EOM are normal.  Neck: Normal range of motion. Neck supple. No JVD present. No thyromegaly present.  Cardiovascular: Normal rate, regular rhythm, normal heart sounds and intact distal pulses.   Pulmonary/Chest: Effort normal and breath sounds normal.  Congested cough  Abdominal: Soft. Bowel sounds are normal. He exhibits no distension and no mass. There is no tenderness. There is no rebound and no guarding.  Musculoskeletal: Normal range of motion. He exhibits no edema and no tenderness.  Lymphadenopathy:    He has no cervical adenopathy.  Neurological: He is alert and oriented to person, place, and time. He has normal reflexes. No cranial  nerve deficit. Coordination normal.  Skin: Skin is warm and dry.  Psychiatric: He has a normal mood and affect. His behavior is normal. Judgment and thought content normal.          Assessment & Plan:  1.  3 month F/U for HTN, Cholesterol, Pre-Dm, D. Deficient. Needs healthy diet, cardio QD and obtain healthy weight. Check Labs, Check BP if >130/80 call office 2.cough/Tobacco Dep- advised cessation techniques and need for d/c to decrease Risk, continue Zpak AD, add Pred DP 10 mg AD, OOW x 3 days  3. Fatigue- check labs, increase activity and H2O

## 2013-03-02 LAB — VITAMIN D 25 HYDROXY (VIT D DEFICIENCY, FRACTURES): Vit D, 25-Hydroxy: 26 ng/mL — ABNORMAL LOW (ref 30–89)

## 2013-03-04 DIAGNOSIS — I10 Essential (primary) hypertension: Secondary | ICD-10-CM | POA: Insufficient documentation

## 2013-03-04 DIAGNOSIS — F329 Major depressive disorder, single episode, unspecified: Secondary | ICD-10-CM | POA: Insufficient documentation

## 2013-03-04 DIAGNOSIS — E785 Hyperlipidemia, unspecified: Secondary | ICD-10-CM | POA: Insufficient documentation

## 2013-03-04 DIAGNOSIS — R7303 Prediabetes: Secondary | ICD-10-CM | POA: Insufficient documentation

## 2013-03-04 DIAGNOSIS — F419 Anxiety disorder, unspecified: Secondary | ICD-10-CM | POA: Insufficient documentation

## 2013-03-04 DIAGNOSIS — F32A Depression, unspecified: Secondary | ICD-10-CM | POA: Insufficient documentation

## 2013-03-28 ENCOUNTER — Ambulatory Visit: Payer: Self-pay | Admitting: Internal Medicine

## 2013-04-05 ENCOUNTER — Other Ambulatory Visit: Payer: Self-pay | Admitting: Internal Medicine

## 2013-05-28 ENCOUNTER — Other Ambulatory Visit: Payer: Self-pay | Admitting: Internal Medicine

## 2013-06-10 ENCOUNTER — Ambulatory Visit: Payer: Self-pay | Admitting: Internal Medicine

## 2013-07-18 ENCOUNTER — Other Ambulatory Visit: Payer: Self-pay | Admitting: *Deleted

## 2013-07-18 MED ORDER — CLONAZEPAM 2 MG PO TABS: 2.0000 mg | ORAL_TABLET | Freq: Three times a day (TID) | ORAL | Status: DC

## 2013-07-21 ENCOUNTER — Encounter: Payer: Self-pay | Admitting: Internal Medicine

## 2013-07-21 ENCOUNTER — Ambulatory Visit (INDEPENDENT_AMBULATORY_CARE_PROVIDER_SITE_OTHER): Payer: 59 | Admitting: Internal Medicine

## 2013-07-21 VITALS — BP 108/76 | HR 80 | Temp 99.0°F | Resp 16 | Ht 74.5 in | Wt 221.8 lb

## 2013-07-21 DIAGNOSIS — I10 Essential (primary) hypertension: Secondary | ICD-10-CM

## 2013-07-21 DIAGNOSIS — Z91148 Patient's other noncompliance with medication regimen for other reason: Secondary | ICD-10-CM

## 2013-07-21 DIAGNOSIS — R7303 Prediabetes: Secondary | ICD-10-CM

## 2013-07-21 DIAGNOSIS — R7309 Other abnormal glucose: Secondary | ICD-10-CM

## 2013-07-21 DIAGNOSIS — Z9114 Patient's other noncompliance with medication regimen: Secondary | ICD-10-CM

## 2013-07-21 DIAGNOSIS — E785 Hyperlipidemia, unspecified: Secondary | ICD-10-CM

## 2013-07-21 DIAGNOSIS — E559 Vitamin D deficiency, unspecified: Secondary | ICD-10-CM

## 2013-07-21 DIAGNOSIS — Z79899 Other long term (current) drug therapy: Secondary | ICD-10-CM

## 2013-07-21 LAB — CBC WITH DIFFERENTIAL/PLATELET
Basophils Absolute: 0.1 10*3/uL (ref 0.0–0.1)
Basophils Relative: 1 % (ref 0–1)
Eosinophils Absolute: 0.3 10*3/uL (ref 0.0–0.7)
Eosinophils Relative: 5 % (ref 0–5)
HCT: 43 % (ref 39.0–52.0)
Hemoglobin: 14.9 g/dL (ref 13.0–17.0)
Lymphocytes Relative: 50 % — ABNORMAL HIGH (ref 12–46)
Lymphs Abs: 2.6 10*3/uL (ref 0.7–4.0)
MCH: 32.7 pg (ref 26.0–34.0)
MCHC: 34.7 g/dL (ref 30.0–36.0)
MCV: 94.3 fL (ref 78.0–100.0)
Monocytes Absolute: 0.5 10*3/uL (ref 0.1–1.0)
Monocytes Relative: 10 % (ref 3–12)
Neutro Abs: 1.8 10*3/uL (ref 1.7–7.7)
Neutrophils Relative %: 34 % — ABNORMAL LOW (ref 43–77)
Platelets: 208 10*3/uL (ref 150–400)
RBC: 4.56 MIL/uL (ref 4.22–5.81)
RDW: 13.7 % (ref 11.5–15.5)
WBC: 5.2 10*3/uL (ref 4.0–10.5)

## 2013-07-21 LAB — HEMOGLOBIN A1C
Hgb A1c MFr Bld: 5.8 % — ABNORMAL HIGH (ref <5.7)
Mean Plasma Glucose: 120 mg/dL — ABNORMAL HIGH (ref <117)

## 2013-07-21 NOTE — Patient Instructions (Signed)
 Hypertension As your heart beats, it forces blood through your arteries. This force is your blood pressure. If the pressure is too high, it is called hypertension (HTN) or high blood pressure. HTN is dangerous because you may have it and not know it. High blood pressure may mean that your heart has to work harder to pump blood. Your arteries may be narrow or stiff. The extra work puts you at risk for heart disease, stroke, and other problems.  Blood pressure consists of two numbers, a higher number over a lower, 110/72, for example. It is stated as "110 over 72." The ideal is below 120 for the top number (systolic) and under 80 for the bottom (diastolic). Write down your blood pressure today. You should pay close attention to your blood pressure if you have certain conditions such as:  Heart failure.  Prior heart attack.  Diabetes  Chronic kidney disease.  Prior stroke.  Multiple risk factors for heart disease. To see if you have HTN, your blood pressure should be measured while you are seated with your arm held at the level of the heart. It should be measured at least twice. A one-time elevated blood pressure reading (especially in the Emergency Department) does not mean that you need treatment. There may be conditions in which the blood pressure is different between your right and left arms. It is important to see your caregiver soon for a recheck. Most people have essential hypertension which means that there is not a specific cause. This type of high blood pressure may be lowered by changing lifestyle factors such as:  Stress.  Smoking.  Lack of exercise.  Excessive weight.  Drug/tobacco/alcohol use.  Eating less salt. Most people do not have symptoms from high blood pressure until it has caused damage to the body. Effective treatment can often prevent, delay or reduce that damage. TREATMENT  When a cause has been identified, treatment for high blood pressure is directed at  the cause. There are a large number of medications to treat HTN. These fall into several categories, and your caregiver will help you select the medicines that are best for you. Medications may have side effects. You should review side effects with your caregiver. If your blood pressure stays high after you have made lifestyle changes or started on medicines,   Your medication(s) may need to be changed.  Other problems may need to be addressed.  Be certain you understand your prescriptions, and know how and when to take your medicine.  Be sure to follow up with your caregiver within the time frame advised (usually within two weeks) to have your blood pressure rechecked and to review your medications.  If you are taking more than one medicine to lower your blood pressure, make sure you know how and at what times they should be taken. Taking two medicines at the same time can result in blood pressure that is too low. SEEK IMMEDIATE MEDICAL CARE IF:  You develop a severe headache, blurred or changing vision, or confusion.  You have unusual weakness or numbness, or a faint feeling.  You have severe chest or abdominal pain, vomiting, or breathing problems. MAKE SURE YOU:   Understand these instructions.  Will watch your condition.  Will get help right away if you are not doing well or get worse.   Diabetes and Exercise Exercising regularly is important. It is not just about losing weight. It has many health benefits, such as:  Improving your overall fitness, flexibility, and   endurance.  Increasing your bone density.  Helping with weight control.  Decreasing your body fat.  Increasing your muscle strength.  Reducing stress and tension.  Improving your overall health. People with diabetes who exercise gain additional benefits because exercise:  Reduces appetite.  Improves the body's use of blood sugar (glucose).  Helps lower or control blood glucose.  Decreases blood  pressure.  Helps control blood lipids (such as cholesterol and triglycerides).  Improves the body's use of the hormone insulin by:  Increasing the body's insulin sensitivity.  Reducing the body's insulin needs.  Decreases the risk for heart disease because exercising:  Lowers cholesterol and triglycerides levels.  Increases the levels of good cholesterol (such as high-density lipoproteins [HDL]) in the body.  Lowers blood glucose levels. YOUR ACTIVITY PLAN  Choose an activity that you enjoy and set realistic goals. Your health care Sayre Witherington or diabetes educator can help you make an activity plan that works for you. You can break activities into 2 or 3 sessions throughout the day. Doing so is as good as one long session. Exercise ideas include:  Taking the dog for a walk.  Taking the stairs instead of the elevator.  Dancing to your favorite song.  Doing your favorite exercise with a friend. RECOMMENDATIONS FOR EXERCISING WITH TYPE 1 OR TYPE 2 DIABETES   Check your blood glucose before exercising. If blood glucose levels are greater than 240 mg/dL, check for urine ketones. Do not exercise if ketones are present.  Avoid injecting insulin into areas of the body that are going to be exercised. For example, avoid injecting insulin into:  The arms when playing tennis.  The legs when jogging.  Keep a record of:  Food intake before and after you exercise.  Expected peak times of insulin action.  Blood glucose levels before and after you exercise.  The type and amount of exercise you have done.  Review your records with your health care Shawonda Kerce. Your health care Shaquita Fort will help you to develop guidelines for adjusting food intake and insulin amounts before and after exercising.  If you take insulin or oral hypoglycemic agents, watch for signs and symptoms of hypoglycemia. They include:  Dizziness.  Shaking.  Sweating.  Chills.  Confusion.  Drink plenty of water  while you exercise to prevent dehydration or heat stroke. Body water is lost during exercise and must be replaced.  Talk to your health care Ashaya Raftery before starting an exercise program to make sure it is safe for you. Remember, almost any type of activity is better than none.    Cholesterol Cholesterol is a [place], waxy, fat-like protein needed by your body in small amounts. The liver makes all the cholesterol you need. It is carried from the liver by the blood through the blood vessels. Deposits (plaque) may build up on blood vessel walls. This makes the arteries narrower and stiffer. Plaque increases the risk for heart attack and stroke. You cannot feel your cholesterol level even if it is very high. The only way to know is by a blood test to check your lipid (fats) levels. Once you know your cholesterol levels, you should keep a record of the test results. Work with your caregiver to to keep your levels in the desired range. WHAT THE RESULTS MEAN:  Total cholesterol is a rough measure of all the cholesterol in your blood.  LDL is the so-called bad cholesterol. This is the type that deposits cholesterol in the walls of the arteries. You want this   level to be low.  HDL is the good cholesterol because it cleans the arteries and carries the LDL away. You want this level to be high.  Triglycerides are fat that the body can either burn for energy or store. High levels are closely linked to heart disease. DESIRED LEVELS:  Total cholesterol below 200.  LDL below 100 for people at risk, below 70 for very high risk.  HDL above 50 is good, above 60 is best.  Triglycerides below 150. HOW TO LOWER YOUR CHOLESTEROL:  Diet.  Choose fish or [place] meat chicken and turkey, roasted or baked. Limit fatty cuts of red meat, fried foods, and processed meats, such as sausage and lunch meat.  Eat lots of fresh fruits and vegetables. Choose whole grains, beans, pasta, potatoes and cereals.  Use only  small amounts of olive, corn or canola oils. Avoid butter, mayonnaise, shortening or palm kernel oils. Avoid foods with trans-fats.  Use skim/nonfat milk and low-fat/nonfat yogurt and cheeses. Avoid whole milk, cream, ice cream, egg yolks and cheeses. Healthy desserts include angel food cake, ginger snaps, animal crackers, hard candy, popsicles, and low-fat/nonfat frozen yogurt. Avoid pastries, cakes, pies and cookies.  Exercise.  A regular program helps decrease LDL and raises HDL.  Helps with weight control.  Do things that increase your activity level like gardening, walking, or taking the stairs.  Medication.  May be prescribed by your caregiver to help lowering cholesterol and the risk for heart disease.  You may need medicine even if your levels are normal if you have several risk factors. HOME CARE INSTRUCTIONS   Follow your diet and exercise programs as suggested by your caregiver.  Take medications as directed.  Have blood work done when your caregiver feels it is necessary. MAKE SURE YOU:   Understand these instructions.  Will watch your condition.  Will get help right away if you are not doing well or get worse.      Vitamin D Deficiency Vitamin D is an important vitamin that your body needs. Having too little of it in your body is called a deficiency. A very bad deficiency can make your bones soft and can cause a condition called rickets.  Vitamin D is important to your body for different reasons, such as:   It helps your body absorb 2 minerals called calcium and phosphorus.  It helps make your bones healthy.  It may prevent some diseases, such as diabetes and multiple sclerosis.  It helps your muscles and heart. You can get vitamin D in several ways. It is a natural part of some foods. The vitamin is also added to some dairy products and cereals. Some people take vitamin D supplements. Also, your body makes vitamin D when you are in the sun. It changes the  sun's rays into a form of the vitamin that your body can use. CAUSES   Not eating enough foods that contain vitamin D.  Not getting enough sunlight.  Having certain digestive system diseases that make it hard to absorb vitamin D. These diseases include Crohn's disease, chronic pancreatitis, and cystic fibrosis.  Having a surgery in which part of the stomach or small intestine is removed.  Being obese. Fat cells pull vitamin D out of your blood. That means that obese people may not have enough vitamin D left in their blood and in other body tissues.  Having chronic kidney or liver disease. RISK FACTORS Risk factors are things that make you more likely to develop a vitamin   D deficiency. They include:  Being older.  Not being able to get outside very much.  Living in a nursing home.  Having had broken bones.  Having weak or thin bones (osteoporosis).  Having a disease or condition that changes how your body absorbs vitamin D.  Having dark skin.  Some medicines such as seizure medicines or steroids.  Being overweight or obese. SYMPTOMS Mild cases of vitamin D deficiency may not have any symptoms. If you have a very bad case, symptoms may include:  Bone pain.  Muscle pain.  Falling often.  Broken bones caused by a minor injury, due to osteoporosis. DIAGNOSIS A blood test is the best way to tell if you have a vitamin D deficiency. TREATMENT Vitamin D deficiency can be treated in different ways. Treatment for vitamin D deficiency depends on what is causing it. Options include:  Taking vitamin D supplements.  Taking a calcium supplement. Your caregiver will suggest what dose is best for you. HOME CARE INSTRUCTIONS  Take any supplements that your caregiver prescribes. Follow the directions carefully. Take only the suggested amount.  Have your blood tested 2 months after you start taking supplements.  Eat foods that contain vitamin D. Healthy choices  include:  Fortified dairy products, cereals, or juices. Fortified means vitamin D has been added to the food. Check the label on the package to be sure.  Fatty fish like salmon or trout.  Eggs.  Oysters.  Do not use a tanning bed.  Keep your weight at a healthy level. Lose weight if you need to.  Keep all follow-up appointments. Your caregiver will need to perform blood tests to make sure your vitamin D deficiency is going away. SEEK MEDICAL CARE IF:  You have any questions about your treatment.  You continue to have symptoms of vitamin D deficiency.  You have nausea or vomiting.  You are constipated.  You feel confused.  You have severe abdominal or back pain. MAKE SURE YOU:  Understand these instructions.  Will watch your condition.  Will get help right away if you are not doing well or get worse.   

## 2013-07-21 NOTE — Progress Notes (Signed)
Patient ID: Victor Pham, male   DOB: 05-17-1950, 63 y.o.   MRN: [id number]    This very nice 63 y.o.MWM presents for 3 month follow up with Hypertension, Hyperlipidemia, Pre-Diabetes and Vitamin D Deficiency. He does demonstrate poor compliance with his recommended medications and just smiles when he discusses his sporadic compliance.   HTN predates since 2002. BP has been controlled and today's BP: 108/76 mmHg. Patient denies any cardiac type chest pain, palpitations, dyspnea/orthopnea/PND, dizziness, claudication, or dependent edema.   Hyperlipidemia is not controlled with diet & meds. Last Lipids as below were suspected done when patient was sporadically taking his medications. Patient denies myalgias or other med SE's.  Lab Results  Component Value Date   CHOL 228* 03/01/2013   HDL 54 03/01/2013   LDLCALC 145* 03/01/2013   TRIG 143 03/01/2013   CHOLHDL 4.2 03/01/2013    Also, the patient has history of PreDiabetes/insulin resistance since 6/20011 (A1c 5.7%) and last A1c was 5.9% un Jan 2015. Patient denies any symptoms of reactive hypoglycemia, diabetic polys, paresthesias or visual blurring.   Further, Patient has history of Vitamin D Deficiency of 29 in 2009 and last vitamin D was 26 of supplements in March 2015. Patient supplements vitamin D without any suspected side-effects.   Medication List   aspirin 81 MG tablet  Take 81 mg by mouth daily.     cholecalciferol 1000 UNITS tablet  Commonly known as:  VITAMIN D  Take 4,000 Units by mouth QID.     citalopram 40 MG tablet  Commonly known as:  CELEXA  Take 40 mg by mouth daily.     clonazePAM 2 MG tablet  Commonly known as:  KLONOPIN  Take 1 tablet (2 mg total) by mouth 3 (three) times daily.     rosuvastatin 40 MG tablet  Commonly known as:  CRESTOR  Take 1 tablet (40 mg total) by mouth daily.       Allergies  Allergen Reactions  . Lipitor [Atorvastatin]   . Penicillins     REACTION: hives   PMHx:   Past Medical  History  Diagnosis Date  . Personal history of adenomatous colonic polyps 12/28/2011    01/2009 = 3 diminutive adenomas   . Hypertension   . Hyperlipidemia   . Urticaria   . Pre-diabetes   . Cataract     removed  . Depression   . Anxiety    FHx:    Reviewed / unchanged  SHx:    Reviewed / unchanged   Systems Review: Constitutional: Denies fever, chills, wt changes, headaches, insomnia, fatigue, night sweats, change in appetite. Eyes: Denies redness, blurred vision, diplopia, discharge, itchy, watery eyes.  ENT: Denies discharge, congestion, post nasal drip, epistaxis, sore throat, earache, hearing loss, dental pain, tinnitus, vertigo, sinus pain, snoring.  CV: Denies chest pain, palpitations, irregular heartbeat, syncope, dyspnea, diaphoresis, orthopnea, PND, claudication or edema. Respiratory: denies cough, dyspnea, DOE, pleurisy, hoarseness, laryngitis, wheezing.  Gastrointestinal: Denies dysphagia, odynophagia, heartburn, reflux, water brash, abdominal pain or cramps, nausea, vomiting, bloating, diarrhea, constipation, hematemesis, melena, hematochezia  or hemorrhoids. Genitourinary: Denies dysuria, frequency, urgency, nocturia, hesitancy, discharge, hematuria or flank pain. Musculoskeletal: Denies arthralgias, myalgias, stiffness, jt. swelling, pain, limping or strain/sprain.  Skin: Denies pruritus, rash, hives, warts, acne, eczema or change in skin lesion(s). Neuro: No weakness, tremor, incoordination, spasms, paresthesia or pain. Psychiatric: Denies confusion, memory loss or sensory loss. Endo: Denies change in weight, skin or hair change.  Heme/Lymph: No excessive bleeding, bruising or enlarged  lymph nodes.   Exam:  BP 108/76  Pulse 80  Temp 99 F   Resp 16  Ht 6' 2.5"   Wt 221 lb 12.8 oz   BMI 28.11 kg/m2  Appears well nourished - in no distress. Eyes: PERRLA, EOMs, conjunctiva no swelling or erythema. Sinuses: No frontal/maxillary tenderness ENT/Mouth: EAC's  clear, TM's nl w/o erythema, bulging. Nares clear w/o erythema, swelling, exudates. Oropharynx clear without erythema or exudates. Oral hygiene is good. Tongue normal, non obstructing. Hearing intact.  Neck: Supple. Thyroid nl. Car 2+/2+ without bruits, nodes or JVD. Chest: Respirations nl with BS clear & equal w/o rales, rhonchi, wheezing or stridor.  Cor: Heart sounds normal w/ regular rate and rhythm without sig. murmurs, gallops, clicks, or rubs. Peripheral pulses normal and equal  without edema.  Abdomen: Soft & bowel sounds normal. Non-tender w/o guarding, rebound, hernias, masses, or organomegaly.  Lymphatics: Unremarkable.  Musculoskeletal: Full ROM all peripheral extremities, joint stability, 5/5 strength, and normal gait.  Skin: Warm, dry without exposed rashes, lesions or ecchymosis apparent.  Neuro: Cranial nerves intact, reflexes equal bilaterally. Sensory-motor testing grossly intact. Tendon reflexes grossly intact.  Pysch: Alert & oriented x 3. Insight and judgement nl & appropriate. No ideations.  Assessment and Plan:  1. Hypertension - Continue monitor blood pressure at home. Continue diet/meds same.  2. Hyperlipidemia - Continue diet/meds, exercise,& lifestyle modifications. Continue monitor periodic cholesterol/liver & renal functions   3. Pre-diabetes/Insulin Resistance - Continue diet, exercise, lifestyle modifications. Monitor appropriate labs.  4. Vitamin D Deficiency - Continue supplementation.  5. Depression  6. Poor Compliance  Recommended regular exercise, BP monitoring, weight control, and discussed med and SE's. Recommended labs to assess and monitor clinical status. Further disposition pending results of labs.

## 2013-07-22 LAB — LIPID PANEL
Cholesterol: 162 mg/dL (ref 0–200)
HDL: 57 mg/dL (ref >39)
LDL Cholesterol: 76 mg/dL (ref 0–99)
Total CHOL/HDL Ratio: 2.8 Ratio
Triglycerides: 144 mg/dL (ref <150)
VLDL: 29 mg/dL (ref 0–40)

## 2013-07-22 LAB — BASIC METABOLIC PANEL WITH GFR
BUN: 7 mg/dL (ref 6–23)
CO2: 27 mEq/L (ref 19–32)
Calcium: 9.1 mg/dL (ref 8.4–10.5)
Chloride: 107 mEq/L (ref 96–112)
Creat: 0.66 mg/dL (ref 0.50–1.35)
GFR, Est African American: >89 mL/min
GFR, Est Non African American: >89 mL/min
Glucose, Bld: 74 mg/dL (ref 70–99)
Potassium: 4.2 mEq/L (ref 3.5–5.3)
Sodium: 140 mEq/L (ref 135–145)

## 2013-07-22 LAB — INSULIN, FASTING: Insulin fasting, serum: 10 u[IU]/mL (ref 3–28)

## 2013-07-22 LAB — HEPATIC FUNCTION PANEL
ALT: 16 U/L (ref 0–53)
AST: 15 U/L (ref 0–37)
Albumin: 4 g/dL (ref 3.5–5.2)
Alkaline Phosphatase: 71 U/L (ref 39–117)
Bilirubin, Direct: 0.1 mg/dL (ref 0.0–0.3)
Indirect Bilirubin: 0.3 mg/dL (ref 0.2–1.2)
Total Bilirubin: 0.4 mg/dL (ref 0.2–1.2)
Total Protein: 6.7 g/dL (ref 6.0–8.3)

## 2013-07-22 LAB — TSH: TSH: 0.481 u[IU]/mL (ref 0.350–4.500)

## 2013-07-22 LAB — MAGNESIUM: Magnesium: 2.2 mg/dL (ref 1.5–2.5)

## 2013-07-22 LAB — VITAMIN D 25 HYDROXY (VIT D DEFICIENCY, FRACTURES): Vit D, 25-Hydroxy: 45 ng/mL (ref 30–89)

## 2013-07-28 ENCOUNTER — Other Ambulatory Visit: Payer: Self-pay | Admitting: Internal Medicine

## 2013-08-19 ENCOUNTER — Other Ambulatory Visit: Payer: Self-pay | Admitting: Internal Medicine

## 2013-10-15 ENCOUNTER — Other Ambulatory Visit: Payer: Self-pay | Admitting: Emergency Medicine

## 2013-10-16 ENCOUNTER — Other Ambulatory Visit: Payer: Self-pay | Admitting: Emergency Medicine

## 2013-11-03 ENCOUNTER — Ambulatory Visit: Payer: Self-pay | Admitting: Physician Assistant

## 2013-12-06 ENCOUNTER — Encounter: Payer: Self-pay | Admitting: Internal Medicine

## 2013-12-10 ENCOUNTER — Other Ambulatory Visit: Payer: Self-pay | Admitting: Internal Medicine

## 2013-12-15 ENCOUNTER — Encounter: Payer: Self-pay | Admitting: Internal Medicine

## 2013-12-15 ENCOUNTER — Ambulatory Visit (INDEPENDENT_AMBULATORY_CARE_PROVIDER_SITE_OTHER): Payer: 59 | Admitting: Internal Medicine

## 2013-12-15 VITALS — BP 154/90 | HR 80 | Temp 97.7°F | Resp 16 | Ht 75.0 in | Wt 228.0 lb

## 2013-12-15 DIAGNOSIS — E559 Vitamin D deficiency, unspecified: Secondary | ICD-10-CM

## 2013-12-15 DIAGNOSIS — R7303 Prediabetes: Secondary | ICD-10-CM

## 2013-12-15 DIAGNOSIS — R6889 Other general symptoms and signs: Secondary | ICD-10-CM

## 2013-12-15 DIAGNOSIS — Z0001 Encounter for general adult medical examination with abnormal findings: Secondary | ICD-10-CM

## 2013-12-15 DIAGNOSIS — R7989 Other specified abnormal findings of blood chemistry: Secondary | ICD-10-CM

## 2013-12-15 DIAGNOSIS — Z125 Encounter for screening for malignant neoplasm of prostate: Secondary | ICD-10-CM

## 2013-12-15 DIAGNOSIS — Z111 Encounter for screening for respiratory tuberculosis: Secondary | ICD-10-CM

## 2013-12-15 DIAGNOSIS — E785 Hyperlipidemia, unspecified: Secondary | ICD-10-CM

## 2013-12-15 DIAGNOSIS — I10 Essential (primary) hypertension: Secondary | ICD-10-CM

## 2013-12-15 DIAGNOSIS — R945 Abnormal results of liver function studies: Secondary | ICD-10-CM

## 2013-12-15 DIAGNOSIS — Z1212 Encounter for screening for malignant neoplasm of rectum: Secondary | ICD-10-CM

## 2013-12-15 DIAGNOSIS — F32A Depression, unspecified: Secondary | ICD-10-CM

## 2013-12-15 DIAGNOSIS — F329 Major depressive disorder, single episode, unspecified: Secondary | ICD-10-CM

## 2013-12-15 DIAGNOSIS — Z113 Encounter for screening for infections with a predominantly sexual mode of transmission: Secondary | ICD-10-CM

## 2013-12-15 LAB — CBC WITH DIFFERENTIAL/PLATELET
Basophils Absolute: 0.1 10*3/uL (ref 0.0–0.1)
Basophils Relative: 1 % (ref 0–1)
Eosinophils Absolute: 0.2 10*3/uL (ref 0.0–0.7)
Eosinophils Relative: 4 % (ref 0–5)
HCT: 45.8 % (ref 39.0–52.0)
Hemoglobin: 15.6 g/dL (ref 13.0–17.0)
Lymphocytes Relative: 48 % — ABNORMAL HIGH (ref 12–46)
Lymphs Abs: 2.8 10*3/uL (ref 0.7–4.0)
MCH: 32.9 pg (ref 26.0–34.0)
MCHC: 34.1 g/dL (ref 30.0–36.0)
MCV: 96.6 fL (ref 78.0–100.0)
Monocytes Absolute: 0.6 10*3/uL (ref 0.1–1.0)
Monocytes Relative: 11 % (ref 3–12)
Neutro Abs: 2.1 10*3/uL (ref 1.7–7.7)
Neutrophils Relative %: 36 % — ABNORMAL LOW (ref 43–77)
Platelets: 216 10*3/uL (ref 150–400)
RBC: 4.74 MIL/uL (ref 4.22–5.81)
RDW: 13.4 % (ref 11.5–15.5)
WBC: 5.8 10*3/uL (ref 4.0–10.5)

## 2013-12-15 NOTE — Progress Notes (Signed)
Patient ID: Victor Pham, male   DOB: 04/07/1950, 63 y.o.   MRN: [id number]  Annual Screening Comprehensive Examination  This very nice 63 y.o.MWM presents for complete physical.  Patient has been followed for HTN, Prediabetes, Hyperlipidemia, and Vitamin D Deficiency.   HTN predates since 2002. Patient's BP has been controlled at home.Today's BP: (!) 154/90 mmHg.  Patient does not monitor BP at home. Patient denies any cardiac symptoms as chest pain, palpitations, shortness of breath, dizziness or ankle swelling.   Patient's hyperlipidemia is controlled with diet and medications. Patient denies myalgias or other medication SE's. Last lipids were at goal - Total Chol 162; HDL 57; LDL 76; Trig 144 on 07/21/2013.   Patient has  prediabetes since  2011 with A1c 5/7% and A1c 5.8% in 2012. Patient denies reactive hypoglycemic symptoms, visual blurring, diabetic polys or paresthesias. Last A1c was 5.8%  on  07/21/2013.   Finally, patient has history of Vitamin D Deficiency of 29 in 2009  and last vitamin D was 45 on  07/21/2013.   Medication Sig  . aspirin 81 MG tablet Take 81 mg by mouth daily.  . cholecalciferol (VITAMIN D) 1000 UNITS tablet Take 4,000 Units by mouth QID.  Marland Kitchen citalopram (CELEXA) 40 MG tablet TAKE 1 TABLET BY MOUTH EVERY DAY  . clonazePAM (KLONOPIN) 2 MG tablet TAKE 1 TABLET BY MOUTH 3 TIMES DAILY  . CRESTOR 40 MG tablet TAKE 1 TABLET (40 MG TOTAL) BY MOUTH DAILY.   Allergies  Allergen Reactions  . Lipitor [Atorvastatin]   . Penicillins     REACTION: hives   Past Medical History  Diagnosis Date  . Personal history of adenomatous colonic polyps 12/28/2011    01/2009 = 3 diminutive adenomas   . Hypertension   . Hyperlipidemia   . Urticaria   . Pre-diabetes   . Cataract     removed  . Depression   . Anxiety    Health Maintenance  Topic Date Due  . ZOSTAVAX  09/15/2012  . TETANUS/TDAP  06/12/2013  . INFLUENZA VACCINE  09/03/2013  . COLONOSCOPY  01/03/2018    Immunization History  Administered Date(s) Administered  . PPD Test 12/15/2013  . Pneumococcal Polysaccharide-23 12/06/2008  . Td 06/13/2003   Past Surgical History  Procedure Laterality Date  . Colonoscopy    . Prostate surgery  2007    Biopsy/Dr. Risa Grill  . Eye surgery    . Cataract extraction w/ intraocular lens  implant, bilateral Bilateral 2010    Dr. Waynetta Sandy   Family History  Problem Relation Age of Onset  . Asthma Mother   . Hypertension Father   . Colon cancer Neg Hx   . Pancreatic cancer Neg Hx   . Stomach cancer Neg Hx   . Esophageal cancer Neg Hx   . Rectal cancer Neg Hx   . Prostate cancer Neg Hx    History   Social History  . Marital Status: Married    Spouse Name: N/A    Number of Children: N/A  . Years of Education: N/A   Occupational History  . Animal nutritionist for [employer] History Main Topics  . Smoking status: Current Every Day Smoker -- 1.50 packs/day  . Smokeless tobacco: Never Used  . Alcohol Use: 1.2 oz/week    2 Cans of beer per week  . Drug Use: No  . Sexual Activity: Active   ROS Constitutional: Denies fever, chills, weight loss/gain, headaches, insomnia, fatigue, night sweats or change  in appetite. Eyes: Denies redness, blurred vision, diplopia, discharge, itchy or watery eyes.  ENT: Denies discharge, congestion, post nasal drip, epistaxis, sore throat, earache, hearing loss, dental pain, Tinnitus, Vertigo, Sinus pain or snoring.  Cardio: Denies chest pain, palpitations, irregular heartbeat, syncope, dyspnea, diaphoresis, orthopnea, PND, claudication or edema Respiratory: denies cough, dyspnea, DOE, pleurisy, hoarseness, laryngitis or wheezing.  Gastrointestinal: Denies dysphagia, heartburn, reflux, water brash, pain, cramps, nausea, vomiting, bloating, diarrhea, constipation, hematemesis, melena, hematochezia, jaundice or hemorrhoids Genitourinary: Denies dysuria, frequency, urgency, nocturia, hesitancy, discharge,  hematuria or flank pain Musculoskeletal: Denies arthralgia, myalgia, stiffness, Jt. Swelling, pain, limp or strain/sprain. Denies Falls. Skin: Denies puritis, rash, hives, warts, acne, eczema or change in skin lesion Neuro: No weakness, tremor, incoordination, spasms, paresthesia or pain Psychiatric: Denies confusion, memory loss or sensory loss. Denies Depression. Endocrine: Denies change in weight, skin, hair change, nocturia, and paresthesia, diabetic polys, visual blurring or hyper / hypo glycemic episodes.  Heme/Lymph: No excessive bleeding, bruising or enlarged lymph nodes.  Physical Exam  BP 154/90 mmHg  Pulse 80  Temp(Src) 97.7 F (36.5 C)  Resp 16  Ht 6\' 3"  (1.905 m)  Wt 228 lb (103.42 kg)  BMI 28.50 kg/m2  General Appearance: Well nourished, in no apparent distress. Eyes: PERRLA, EOMs, conjunctiva no swelling or erythema, normal fundi and vessels. Sinuses: No frontal/maxillary tenderness ENT/Mouth: EACs patent / TMs  nl. Nares clear without erythema, swelling, mucoid exudates. Oral hygiene is good. No erythema, swelling, or exudate. Tongue normal, non-obstructing. Tonsils not swollen or erythematous. Hearing normal.  Neck: Supple, thyroid normal. No bruits, nodes or JVD. Respiratory: Respiratory effort normal.  BS equal and clear bilateral without rales, rhonci, wheezing or stridor. Cardio: Heart sounds are normal with regular rate and rhythm and no murmurs, rubs or gallops. Peripheral pulses are normal and equal bilaterally without edema. No aortic or femoral bruits. Chest: symmetric with normal excursions and percussion.  Abdomen: Flat, soft, with bowl sounds. Nontender, no guarding, rebound, hernias, masses, or organomegaly.  Lymphatics: Non tender without lymphadenopathy.  Genitourinary: No hernias.Testes nl. DRE - prostate nl for age - smooth & firm w/o nodules. Musculoskeletal: Full ROM all peripheral extremities, joint stability, 5/5 strength, and normal gait. Skin:  Warm and dry without rashes, lesions, cyanosis, clubbing or  ecchymosis.  Neuro: Cranial nerves intact, reflexes equal bilaterally. Normal muscle tone, no cerebellar symptoms. Sensation intact.  Pysch: Awake and oriented X 3with normal affect, insight and judgment appropriate.   Assessment and Plan  1. Annual Screening Examination 2. Hypertension  3. Hyperlipidemia 4. Pre Diabetes 5. Vitamin D Deficiency   Continue prudent diet as discussed, weight control, BP monitoring, regular exercise, and medications as discussed.  Discussed med effects and SE's. Routine screening labs and tests as requested with regular follow-up as recommended. Advised to call if BP's remain elevated. Discouraged smoking. Recc CXR.

## 2013-12-15 NOTE — Patient Instructions (Signed)
   Recommend the book "The END of DIETING" by Dr Joel Furman   and the book "The END of DIABETES " by Dr Joel Fuhrman  At [employer] - get book & Audio CD's      Being diabetic has a  300% increased risk for heart attack, stroke, cancer, and alzheimer- type vascular dementia. It is very important that you work harder with diet by avoiding all foods that are Budai except chicken & fish. Avoid B[place] rice (brown & wild rice is OK), Stickley potatoes (sweetpotatoes in moderation is OK), Dineen bread or wheat bread or anything made out of [place] flour like bagels, donuts, rolls, buns, biscuits, cakes, pastries, cookies, pizza crust, and pasta (made from [place] flour & egg whites) - vegetarian pasta or spinach or wheat pasta is OK. Multigrain breads like Arnold's or Pepperidge Farm, or multigrain sandwich thins or flatbreads.  Diet, exercise and weight loss can reverse and cure diabetes in the early stages.  Diet, exercise and weight loss is very important in the control and prevention of complications of diabetes which affects every system in your body, ie. Brain - dementia/stroke, eyes - glaucoma/blindness, heart - heart attack/heart failure, kidneys - dialysis, stomach - gastric paralysis, intestines - malabsorption, nerves - severe painful neuritis, circulation - gangrene & loss of a leg(s), and finally cancer and Alzheimers.    I recommend avoid fried & greasy foods,  sweets/candy, Baez rice (brown or wild rice or Quinoa is OK), G[place]vich potatoes (sweet potatoes are OK) - anything made from [place] flour - bagels, doughnuts, rolls, buns, biscuits,Kortz and wheat breads, pizza crust and traditional pasta made of [place] flour & egg Cadman(vegetarian pasta or spinach or wheat pasta is OK).  Multi-grain bread is OK - like multi-grain flat bread or sandwich thins. Avoid alcohol in excess. Exercise is also important.    Eat all the vegetables you want - avoid meat, especially red meat and dairy - especially cheese.  Cheese  is the most concentrated form of trans-fats which is the worst thing to clog up our arteries. Veggie cheese is OK which can be found in the fresh produce section at [employer] or [employer] or [employer]   

## 2013-12-16 LAB — HEPATIC FUNCTION PANEL
ALT: 18 U/L (ref 0–53)
AST: 17 U/L (ref 0–37)
Albumin: 4.2 g/dL (ref 3.5–5.2)
Alkaline Phosphatase: 80 U/L (ref 39–117)
Bilirubin, Direct: 0.1 mg/dL (ref 0.0–0.3)
Indirect Bilirubin: 0.4 mg/dL (ref 0.2–1.2)
Total Bilirubin: 0.5 mg/dL (ref 0.2–1.2)
Total Protein: 7.1 g/dL (ref 6.0–8.3)

## 2013-12-16 LAB — MAGNESIUM: Magnesium: 2.2 mg/dL (ref 1.5–2.5)

## 2013-12-16 LAB — TSH: TSH: 0.829 u[IU]/mL (ref 0.350–4.500)

## 2013-12-16 LAB — LIPID PANEL
Cholesterol: 192 mg/dL (ref 0–200)
HDL: 66 mg/dL (ref >39)
LDL Cholesterol: 101 mg/dL — ABNORMAL HIGH (ref 0–99)
Total CHOL/HDL Ratio: 2.9 Ratio
Triglycerides: 124 mg/dL (ref <150)
VLDL: 25 mg/dL (ref 0–40)

## 2013-12-16 LAB — IRON AND TIBC
%SAT: 42 % (ref 20–55)
Iron: 154 ug/dL (ref 42–165)
TIBC: 370 ug/dL (ref 215–435)
UIBC: 216 ug/dL (ref 125–400)

## 2013-12-16 LAB — BASIC METABOLIC PANEL WITH GFR
BUN: 10 mg/dL (ref 6–23)
CO2: 28 mEq/L (ref 19–32)
Calcium: 9.6 mg/dL (ref 8.4–10.5)
Chloride: 100 mEq/L (ref 96–112)
Creat: 0.68 mg/dL (ref 0.50–1.35)
GFR, Est African American: >89 mL/min
GFR, Est Non African American: >89 mL/min
Glucose, Bld: 110 mg/dL — ABNORMAL HIGH (ref 70–99)
Potassium: 4.5 mEq/L (ref 3.5–5.3)
Sodium: 136 mEq/L (ref 135–145)

## 2013-12-16 LAB — URINALYSIS, MICROSCOPIC ONLY
Bacteria, UA: NONE SEEN
Casts: NONE SEEN
Crystals: NONE SEEN
Squamous Epithelial / LPF: NONE SEEN

## 2013-12-16 LAB — VITAMIN B12: Vitamin B-12: 277 pg/mL (ref 211–911)

## 2013-12-16 LAB — MICROALBUMIN / CREATININE URINE RATIO
Creatinine, Urine: 35 mg/dL
Microalb Creat Ratio: 5.7 mg/g (ref 0.0–30.0)
Microalb, Ur: 0.2 mg/dL (ref <2.0)

## 2013-12-16 LAB — TESTOSTERONE: Testosterone: 626 ng/dL (ref 300–890)

## 2013-12-16 LAB — INSULIN, FASTING: Insulin fasting, serum: 8.1 u[IU]/mL (ref 2.0–19.6)

## 2013-12-16 LAB — HEMOGLOBIN A1C
Hgb A1c MFr Bld: 5.8 % — ABNORMAL HIGH (ref <5.7)
Mean Plasma Glucose: 120 mg/dL — ABNORMAL HIGH (ref <117)

## 2013-12-16 LAB — PSA: PSA: 1.09 ng/mL (ref <=4.00)

## 2013-12-16 LAB — VITAMIN D 25 HYDROXY (VIT D DEFICIENCY, FRACTURES): Vit D, 25-Hydroxy: 31 ng/mL (ref 30–89)

## 2013-12-19 LAB — TB SKIN TEST
Induration: 0 mm
TB Skin Test: NEGATIVE

## 2014-01-04 ENCOUNTER — Other Ambulatory Visit: Payer: Self-pay | Admitting: Internal Medicine

## 2014-01-04 MED ORDER — CLONAZEPAM 2 MG PO TABS: ORAL_TABLET | ORAL | Status: DC

## 2014-01-05 ENCOUNTER — Other Ambulatory Visit: Payer: Self-pay | Admitting: Internal Medicine

## 2014-01-17 ENCOUNTER — Other Ambulatory Visit: Payer: Self-pay

## 2014-01-17 MED ORDER — ROSUVASTATIN CALCIUM 40 MG PO TABS: ORAL_TABLET | ORAL | Status: DC

## 2014-03-22 ENCOUNTER — Other Ambulatory Visit: Payer: Self-pay | Admitting: Internal Medicine

## 2014-03-23 ENCOUNTER — Telehealth: Payer: Self-pay | Admitting: *Deleted

## 2014-03-23 MED ORDER — CLONAZEPAM 2 MG PO TABS: 2.0000 mg | ORAL_TABLET | Freq: Three times a day (TID) | ORAL | Status: DC

## 2014-03-23 NOTE — Telephone Encounter (Signed)
Left message with spouse to remind patient to get his chest x-ray.

## 2014-03-23 NOTE — Telephone Encounter (Signed)
-----   Message from Unk Pinto, MD sent at 02/14/2014  8:29 PM EST -----  Please call & remind Victor Pham to get CXR - He also forgot the last 2 years  ----- Message -----    From: SYSTEM    Sent: 02/09/2014  12:05 AM      To: Unk Pinto, MD

## 2014-04-10 ENCOUNTER — Encounter (INDEPENDENT_AMBULATORY_CARE_PROVIDER_SITE_OTHER): Payer: Self-pay

## 2014-04-10 ENCOUNTER — Ambulatory Visit (HOSPITAL_COMMUNITY)
Admission: RE | Admit: 2014-04-10 | Discharge: 2014-04-10 | Disposition: A | Discharge status: Home / Self Care | Payer: 59 | Source: Ambulatory Visit | Attending: Internal Medicine | Admitting: Internal Medicine

## 2014-04-10 DIAGNOSIS — Z87891 Personal history of nicotine dependence: Secondary | ICD-10-CM | POA: Insufficient documentation

## 2014-04-10 DIAGNOSIS — R05 Cough: Secondary | ICD-10-CM | POA: Diagnosis not present

## 2014-04-10 DIAGNOSIS — I10 Essential (primary) hypertension: Secondary | ICD-10-CM

## 2014-04-10 DIAGNOSIS — J8410 Pulmonary fibrosis, unspecified: Secondary | ICD-10-CM | POA: Diagnosis not present

## 2014-04-10 DIAGNOSIS — R0602 Shortness of breath: Secondary | ICD-10-CM | POA: Insufficient documentation

## 2014-04-12 ENCOUNTER — Ambulatory Visit (INDEPENDENT_AMBULATORY_CARE_PROVIDER_SITE_OTHER): Payer: 59 | Admitting: Physician Assistant

## 2014-04-12 VITALS — BP 142/90 | HR 88 | Temp 98.1°F | Resp 16 | Ht 75.0 in | Wt 232.0 lb

## 2014-04-12 DIAGNOSIS — Z79899 Other long term (current) drug therapy: Secondary | ICD-10-CM

## 2014-04-12 DIAGNOSIS — R7303 Prediabetes: Secondary | ICD-10-CM

## 2014-04-12 DIAGNOSIS — J449 Chronic obstructive pulmonary disease, unspecified: Secondary | ICD-10-CM | POA: Insufficient documentation

## 2014-04-12 DIAGNOSIS — E785 Hyperlipidemia, unspecified: Secondary | ICD-10-CM

## 2014-04-12 DIAGNOSIS — J441 Chronic obstructive pulmonary disease with (acute) exacerbation: Secondary | ICD-10-CM

## 2014-04-12 DIAGNOSIS — R7309 Other abnormal glucose: Secondary | ICD-10-CM

## 2014-04-12 DIAGNOSIS — E559 Vitamin D deficiency, unspecified: Secondary | ICD-10-CM

## 2014-04-12 DIAGNOSIS — I10 Essential (primary) hypertension: Secondary | ICD-10-CM

## 2014-04-12 DIAGNOSIS — F17200 Nicotine dependence, unspecified, uncomplicated: Secondary | ICD-10-CM | POA: Insufficient documentation

## 2014-04-12 DIAGNOSIS — Z9114 Patient's other noncompliance with medication regimen: Secondary | ICD-10-CM

## 2014-04-12 DIAGNOSIS — R0600 Dyspnea, unspecified: Secondary | ICD-10-CM

## 2014-04-12 LAB — LIPID PANEL
Cholesterol: 155 mg/dL (ref 0–200)
HDL: 68 mg/dL (ref >=40)
LDL Cholesterol: 73 mg/dL (ref 0–99)
Total CHOL/HDL Ratio: 2.3 Ratio
Triglycerides: 70 mg/dL (ref <150)
VLDL: 14 mg/dL (ref 0–40)

## 2014-04-12 LAB — BASIC METABOLIC PANEL WITH GFR
BUN: 9 mg/dL (ref 6–23)
CO2: 25 mEq/L (ref 19–32)
Calcium: 9.6 mg/dL (ref 8.4–10.5)
Chloride: 102 mEq/L (ref 96–112)
Creat: 0.65 mg/dL (ref 0.50–1.35)
GFR, Est African American: >89 mL/min
GFR, Est Non African American: >89 mL/min
Glucose, Bld: 93 mg/dL (ref 70–99)
Potassium: 4.3 mEq/L (ref 3.5–5.3)
Sodium: 140 mEq/L (ref 135–145)

## 2014-04-12 LAB — CBC WITH DIFFERENTIAL/PLATELET
Basophils Absolute: 0 10*3/uL (ref 0.0–0.1)
Basophils Relative: 0 % (ref 0–1)
Eosinophils Absolute: 0.1 10*3/uL (ref 0.0–0.7)
Eosinophils Relative: 2 % (ref 0–5)
HCT: 46.6 % (ref 39.0–52.0)
Hemoglobin: 15.5 g/dL (ref 13.0–17.0)
Lymphocytes Relative: 38 % (ref 12–46)
Lymphs Abs: 2.8 10*3/uL (ref 0.7–4.0)
MCH: 32.5 pg (ref 26.0–34.0)
MCHC: 33.3 g/dL (ref 30.0–36.0)
MCV: 97.7 fL (ref 78.0–100.0)
MPV: 9.6 fL (ref 8.6–12.4)
Monocytes Absolute: 0.8 10*3/uL (ref 0.1–1.0)
Monocytes Relative: 11 % (ref 3–12)
Neutro Abs: 3.6 10*3/uL (ref 1.7–7.7)
Neutrophils Relative %: 49 % (ref 43–77)
Platelets: 286 10*3/uL (ref 150–400)
RBC: 4.77 MIL/uL (ref 4.22–5.81)
RDW: 13.5 % (ref 11.5–15.5)
WBC: 7.4 10*3/uL (ref 4.0–10.5)

## 2014-04-12 LAB — HEPATIC FUNCTION PANEL
ALT: 15 U/L (ref 0–53)
AST: 16 U/L (ref 0–37)
Albumin: 4 g/dL (ref 3.5–5.2)
Alkaline Phosphatase: 91 U/L (ref 39–117)
Bilirubin, Direct: 0.1 mg/dL (ref 0.0–0.3)
Indirect Bilirubin: 0.3 mg/dL (ref 0.2–1.2)
Total Bilirubin: 0.4 mg/dL (ref 0.2–1.2)
Total Protein: 7.2 g/dL (ref 6.0–8.3)

## 2014-04-12 LAB — TSH: TSH: 0.594 u[IU]/mL (ref 0.350–4.500)

## 2014-04-12 LAB — MAGNESIUM: Magnesium: 2.2 mg/dL (ref 1.5–2.5)

## 2014-04-12 MED ORDER — IPRATROPIUM-ALBUTEROL 0.5-2.5 (3) MG/3ML IN SOLN
3.0000 mL | Freq: Once | RESPIRATORY_TRACT | Status: AC
  Administered 2014-04-12: 3 mL via RESPIRATORY_TRACT

## 2014-04-12 MED ORDER — UMECLIDINIUM-VILANTEROL 62.5-25 MCG/INH IN AEPB: INHALATION_SPRAY | RESPIRATORY_TRACT | Status: DC

## 2014-04-12 MED ORDER — ALBUTEROL SULFATE HFA 108 (90 BASE) MCG/ACT IN AERS
1.0000 | INHALATION_SPRAY | Freq: Four times a day (QID) | RESPIRATORY_TRACT | Status: AC | PRN
Start: 2014-04-12 — End: ?

## 2014-04-12 MED ORDER — PREDNISONE 20 MG PO TABS: ORAL_TABLET | ORAL | Status: DC

## 2014-04-12 MED ORDER — AZITHROMYCIN 250 MG PO TABS: ORAL_TABLET | ORAL | Status: DC

## 2014-04-12 NOTE — Progress Notes (Signed)
Assessment and Plan:  Hypertension: will start on ziac 10mg  1/2 pill day, monitor blood pressure at home. Continue DASH diet.  Reminder to go to the ER if any CP, SOB, nausea, dizziness, severe HA, changes vision/speech, left arm numbness and tingling, and jaw pain. Cholesterol: Continue diet and exercise. Check cholesterol.  Pre-diabetes-Continue diet and exercise. Check A1C Vitamin D Def- check level and continue medications.  COPD with exacerbation- Advised to stop smoking, CXR normal 04/10/2014, duo neb given in office O2 88% RA, will give zpak, prednisone, albuterol inhaler and sample inhaler Smoking cessation-  counseled patient on the dangers of tobacco use, advised patient to stop smoking, and reviewed strategies to maximize success Dyspnea- check EKG, rule out Afib/MI- no changes.   OVER 40 minutes of exam, counseling, chart review, referral performed Close follow up in 1 month to discuss chantix/smoking cessation and inhalers Will recheck BP and Hr at that time too, PT declines ziac at this time.  Continue diet and meds as discussed. Further disposition pending results of labs.  HPI 64 y.o. male  presents for 3 month follow up  has a past medical history of Personal history of adenomatous colonic polyps (12/28/2011); Hypertension; Hyperlipidemia; Urticaria; Pre-diabetes; Cataract; Depression; and Anxiety.  His blood pressure has not been controlled at home, today their BP is BP: (!) 142/90 mmHg  He does not workout. He denies chest pain, shortness of breath, dizziness.  He is on cholesterol medication and denies myalgias. His cholesterol is at goal. The cholesterol last visit was:   Lab Results  Component Value Date   CHOL 192 12/15/2013   HDL 66 12/15/2013   LDLCALC 101* 12/15/2013   TRIG 124 12/15/2013   CHOLHDL 2.9 12/15/2013    He has been working on diet and exercise for prediabetes, and denies paresthesia of the feet, polydipsia, polyuria and visual disturbances. Last A1C  in the office was:  Lab Results  Component Value Date   HGBA1C 5.8* 12/15/2013   Patient is on Vitamin D supplement.   Lab Results  Component Value Date   VD25OH 31 12/15/2013     The patient is currently having symptoms / an exacerbation. Current symptoms include dyspnea after 2 flights of stairs, cough productive of Stfleur sputum in moderate amounts and wheezing. Symptoms have been present since 2 weeks ago and have been gradually worsening. He denies weight increase, chills and fever. Associated symptoms include fatigue, poor exercise tolerance, shortness of breath and wheezing.  This episode appears to have been triggered by dust. Treatments tried for the current exacerbation: none. The patient has been having similar episodes for approximately 3 months. He continues to smoke.    Current Medications:  Current Outpatient Prescriptions on File Prior to Visit  Medication Sig Dispense Refill  . aspirin 81 MG tablet Take 81 mg by mouth daily.    . cholecalciferol (VITAMIN D) 1000 UNITS tablet Take 4,000 Units by mouth QID.    Marland Kitchen citalopram (CELEXA) 40 MG tablet TAKE 1 TABLET BY MOUTH EVERY DAY 90 tablet 1  . clonazePAM (KLONOPIN) 2 MG tablet Take 1 tablet (2 mg total) by mouth 3 (three) times daily. 90 tablet 0  . rosuvastatin (CRESTOR) 40 MG tablet TAKE 1 TABLET (40 MG TOTAL) BY MOUTH DAILY. 30 tablet 3  . tadalafil (CIALIS) 20 MG tablet Take 20 mg by mouth.     No current facility-administered medications on file prior to visit.   Medical History:  Past Medical History  Diagnosis Date  .  Personal history of adenomatous colonic polyps 12/28/2011    01/2009 = 3 diminutive adenomas   . Hypertension   . Hyperlipidemia   . Urticaria   . Pre-diabetes   . Cataract     removed  . Depression   . Anxiety    Allergies:  Allergies  Allergen Reactions  . Lipitor [Atorvastatin]   . Penicillins     REACTION: hives     Review of Systems:  Review of Systems  Constitutional: Positive  for malaise/fatigue. Negative for fever, chills, weight loss and diaphoresis.  HENT: Negative.   Eyes: Negative.   Respiratory: Positive for cough, sputum production, shortness of breath and wheezing. Negative for hemoptysis.   Cardiovascular: Positive for palpitations. Negative for chest pain, orthopnea, claudication, leg swelling and PND.  Gastrointestinal: Negative.   Genitourinary: Negative.   Musculoskeletal: Negative.   Skin: Negative.   Neurological: Negative.  Negative for weakness.  Psychiatric/Behavioral: Negative.     Family history- Review and unchanged Social history- Review and unchanged Physical Exam: BP 142/90 mmHg  Pulse 88  Temp(Src) 98.1 F (36.7 C)  Resp 16  Ht 6\' 3"  (1.905 m)  Wt 232 lb (105.235 kg)  BMI 29.00 kg/m2 Wt Readings from Last 3 Encounters:  04/12/14 232 lb (105.235 kg)  12/15/13 228 lb (103.42 kg)  07/21/13 221 lb 12.8 oz (100.608 kg)   General Appearance: Well nourished, appears anxious, shaky Eyes: PERRLA, EOMs, conjunctiva no swelling or erythema Sinuses: No Frontal/maxillary tenderness ENT/Mouth: Ext aud canals clear, TMs without erythema, bulging. No erythema, swelling, or exudate on post pharynx.  Tonsils not swollen or erythematous. Hearing normal.  Neck: Supple, thyroid normal.  Respiratory: decreased BS with + wheezing with expiration, no rales, rhonci or focal wheezing, O2 88%RA to 96% after duoneb Cardio: RRR with no MRGs. Brisk peripheral pulses without edema.  Abdomen: Soft, + BS,  Non tender, no guarding, rebound, hernias, masses. Lymphatics: Non tender without lymphadenopathy.  Musculoskeletal: Full ROM, 5/5 strength, Normal gait Skin: Warm, dry without rashes, lesions, ecchymosis.  Neuro: Cranial nerves intact. Normal muscle tone, no cerebellar symptoms. Psych: Awake and oriented X 3, normal affect, Insight and Judgment appropriate.    Vicie Mutters, PA-C 8:55 AM [facility]

## 2014-04-12 NOTE — Patient Instructions (Addendum)
Decrease to 1/2 pill of the celexa every day for 2 weeks, then go to every other day for 2 weeks and then stop it.  Start on ziac 10 mg, start on 1/2 pill daily can take at night  We are giving you chantix for smoking cessation. You can do it! And we are here to help! You may have heard some scary side effects about chantix, the three most common I hear about are nausea, crazy dreams and depression.  However, I like for my patients to try to stay on 1/2 a tablet twice a day rather than one tablet twice a day as normally prescribed. This helps decrease the chances of side effects and helps save money by making a one month prescription last two months  Please start the prescription this way:  Start 1/2 tablet by mouth once daily after food with a full glass of water for 3 days Then do 1/2 tablet by mouth twice daily for 4 days. During this first week you can smoke, but try to stop after this week.  At this point we have several options: 1) continue on 1/2 tablet twice a day- which I encourage you to do. You can stay on this dose the rest of the time on the medication or if you still feel the need to smoke you can do one of the two options below. 2) do one tablet in the morning and 1/2 in the evening which helps decrease dreams. 3) do one tablet twice a day.   What if I miss a dose? If you miss a dose, take it as soon as you can. If it is almost time for your next dose, take only that dose. Do not take double or extra doses.  What should I watch for while using this medicine? Visit your doctor or health care professional for regular check ups. Ask for ongoing advice and encouragement from your doctor or healthcare professional, friends, and family to help you quit. If you smoke while on this medication, quit again  Your mouth may get dry. Chewing sugarless gum or hard candy, and drinking plenty of water may help. Contact your doctor if the problem does not go away or is severe.  You may get  drowsy or dizzy. Do not drive, use machinery, or do anything that needs mental alertness until you know how this medicine affects you. Do not stand or sit up quickly, especially if you are an older patient.   The use of this medicine may increase the chance of suicidal thoughts or actions. Pay special attention to how you are responding while on this medicine. Any worsening of mood, or thoughts of suicide or dying should be reported to your health care professional right away.  ADVANTAGES OF QUITTING SMOKING  Within 20 minutes, blood pressure decreases. Your pulse is at normal level.  After 8 hours, carbon monoxide levels in the blood return to normal. Your oxygen level increases.  After 24 hours, the chance of having a heart attack starts to decrease. Your breath, hair, and body stop smelling like smoke.  After 48 hours, damaged nerve endings begin to recover. Your sense of taste and smell improve.  After 72 hours, the body is virtually free of nicotine. Your bronchial tubes relax and breathing becomes easier.  After 2 to 12 weeks, lungs can hold more air. Exercise becomes easier and circulation improves.  After 1 year, the risk of coronary heart disease is cut in half.  After 5 years, the  risk of stroke falls to the same as a nonsmoker.  After 10 years, the risk of lung cancer is cut in half and the risk of other cancers decreases significantly.  After 15 years, the risk of coronary heart disease drops, usually to the level of a nonsmoker.  You will have extra money to spend on things other than cigarettes.    Chronic Obstructive Pulmonary Disease Chronic obstructive pulmonary disease (COPD) is a common lung problem. In COPD, the flow of air from the lungs is limited. The way your lungs work will probably never return to normal, but there are things you can do to improve your lungs and make yourself feel better. HOME CARE  Take all medicines as told by your doctor.  Avoid  medicines or cough syrups that dry up your airway (such as antihistamines) and do not allow you to get rid of thick spit. You do not need to avoid them if told differently by your doctor.  If you smoke, stop. Smoking makes the problem worse.  Avoid being around things that make your breathing worse (like smoke, chemicals, and fumes).  Use oxygen therapy and therapy to help improve your lungs (pulmonary rehabilitation) if told by your doctor. If you need home oxygen therapy, ask your doctor if you should buy a tool to measure your oxygen level (oximeter).  Avoid people who have a sickness you can catch (contagious).  Avoid going outside when it is very hot, cold, or humid.  Eat healthy foods. Eat smaller meals more often. Rest before meals.  Stay active, but remember to also rest.  Make sure to get all the shots (vaccines) your doctor recommends. Ask your doctor if you need a pneumonia shot.  Learn and use tips on how to relax.  Learn and use tips on how to control your breathing as told by your doctor. Try:  Breathing in (inhaling) through your nose for 1 second. Then, pucker your lips and breath out (exhale) through your lips for 2 seconds.  Putting one hand on your belly (abdomen). Breathe in slowly through your nose for 1 second. Your hand on your belly should move out. Pucker your lips and breathe out slowly through your lips. Your hand on your belly should move in as you breathe out.  Learn and use controlled coughing to clear thick spit from your lungs. The steps are: 1. Lean your head a little forward. 2. Breathe in deeply. 3. Try to hold your breath for 3 seconds. 4. Keep your mouth slightly open while coughing 2 times. 5. Spit any thick spit out into a tissue. 6. Rest and do the steps again 1 or 2 times as needed. GET HELP IF:  You cough up more thick spit than usual.  There is a change in the color or thickness of the spit.  It is harder to breathe than usual.  Your  breathing is faster than usual. GET HELP RIGHT AWAY IF:   You have shortness of breath while resting.  You have shortness of breath that stops you from:  Being able to talk.  Doing normal activities.  You chest hurts for longer than 5 minutes.  Your skin color is more blue than usual.  Your pulse oximeter shows that you have low oxygen for longer than 5 minutes. MAKE SURE YOU:   Understand these instructions.  Will watch your condition.  Will get help right away if you are not doing well or get worse. Document Released: 07/09/2007 Document Revised:  06/06/2013 Document Reviewed: 09/16/2012 Exit[employer] Patient Information 2015 [place], [place]. This information is not intended to replace advice given to you by your health care provider. Make sure you discuss any questions you have with your health care provider.

## 2014-04-13 ENCOUNTER — Other Ambulatory Visit: Payer: Self-pay | Admitting: *Deleted

## 2014-04-13 ENCOUNTER — Telehealth: Payer: Self-pay | Admitting: *Deleted

## 2014-04-13 LAB — VITAMIN D 25 HYDROXY (VIT D DEFICIENCY, FRACTURES): Vit D, 25-Hydroxy: 11 ng/mL — ABNORMAL LOW (ref 30–100)

## 2014-04-13 LAB — HEMOGLOBIN A1C
Hgb A1c MFr Bld: 5.9 % — ABNORMAL HIGH (ref <5.7)
Mean Plasma Glucose: 123 mg/dL — ABNORMAL HIGH (ref <117)

## 2014-04-13 MED ORDER — VITAMIN D (ERGOCALCIFEROL) 1.25 MG (50000 UNIT) PO CAPS: 50000.0000 [IU] | ORAL_CAPSULE | ORAL | Status: DC

## 2014-04-13 NOTE — Telephone Encounter (Signed)
Spoke with spouse who was concerned about the diagnoses on patient' AVS-ie-hypertension.  Patient has a visit soon with Dr Melford Aase and will discuss DX at that time.  Dr Melford Aase aware.

## 2014-04-13 NOTE — Addendum Note (Signed)
Addended by: Vicie Mutters R on: 04/13/2014 08:21 AM   Modules accepted: Orders

## 2014-05-05 ENCOUNTER — Other Ambulatory Visit: Payer: Self-pay | Admitting: *Deleted

## 2014-05-05 MED ORDER — CLONAZEPAM 2 MG PO TABS: 2.0000 mg | ORAL_TABLET | Freq: Three times a day (TID) | ORAL | Status: DC

## 2014-05-16 ENCOUNTER — Ambulatory Visit: Payer: Self-pay | Admitting: Physician Assistant

## 2014-06-05 ENCOUNTER — Other Ambulatory Visit: Payer: Self-pay | Admitting: Internal Medicine

## 2014-06-05 ENCOUNTER — Other Ambulatory Visit: Payer: Self-pay | Admitting: *Deleted

## 2014-06-05 MED ORDER — CLONAZEPAM 2 MG PO TABS: 2.0000 mg | ORAL_TABLET | Freq: Three times a day (TID) | ORAL | Status: DC

## 2014-06-23 ENCOUNTER — Ambulatory Visit (INDEPENDENT_AMBULATORY_CARE_PROVIDER_SITE_OTHER): Payer: 59 | Admitting: Internal Medicine

## 2014-06-23 ENCOUNTER — Encounter: Payer: Self-pay | Admitting: Internal Medicine

## 2014-06-23 VITALS — BP 158/98 | HR 92 | Temp 97.7°F | Resp 16 | Ht 74.5 in | Wt 231.4 lb

## 2014-06-23 DIAGNOSIS — R7309 Other abnormal glucose: Secondary | ICD-10-CM

## 2014-06-23 DIAGNOSIS — I10 Essential (primary) hypertension: Secondary | ICD-10-CM

## 2014-06-23 DIAGNOSIS — E559 Vitamin D deficiency, unspecified: Secondary | ICD-10-CM

## 2014-06-23 DIAGNOSIS — R7303 Prediabetes: Secondary | ICD-10-CM

## 2014-06-23 DIAGNOSIS — E785 Hyperlipidemia, unspecified: Secondary | ICD-10-CM

## 2014-06-23 DIAGNOSIS — Z79899 Other long term (current) drug therapy: Secondary | ICD-10-CM

## 2014-06-23 DIAGNOSIS — F17200 Nicotine dependence, unspecified, uncomplicated: Secondary | ICD-10-CM

## 2014-06-23 DIAGNOSIS — N529 Male erectile dysfunction, unspecified: Secondary | ICD-10-CM

## 2014-06-23 LAB — CBC WITH DIFFERENTIAL/PLATELET
Basophils Absolute: 0.1 10*3/uL (ref 0.0–0.1)
Basophils Relative: 1 % (ref 0–1)
Eosinophils Absolute: 0.3 10*3/uL (ref 0.0–0.7)
Eosinophils Relative: 4 % (ref 0–5)
HCT: 46.2 % (ref 39.0–52.0)
Hemoglobin: 15.8 g/dL (ref 13.0–17.0)
Lymphocytes Relative: 44 % (ref 12–46)
Lymphs Abs: 2.8 10*3/uL (ref 0.7–4.0)
MCH: 33.1 pg (ref 26.0–34.0)
MCHC: 34.2 g/dL (ref 30.0–36.0)
MCV: 96.9 fL (ref 78.0–100.0)
MPV: 9.7 fL (ref 8.6–12.4)
Monocytes Absolute: 0.8 10*3/uL (ref 0.1–1.0)
Monocytes Relative: 12 % (ref 3–12)
Neutro Abs: 2.5 10*3/uL (ref 1.7–7.7)
Neutrophils Relative %: 39 % — ABNORMAL LOW (ref 43–77)
Platelets: 242 10*3/uL (ref 150–400)
RBC: 4.77 MIL/uL (ref 4.22–5.81)
RDW: 14.1 % (ref 11.5–15.5)
WBC: 6.3 10*3/uL (ref 4.0–10.5)

## 2014-06-23 LAB — LIPID PANEL
Cholesterol: 182 mg/dL (ref 0–200)
HDL: 61 mg/dL (ref >=40)
LDL Cholesterol: 94 mg/dL (ref 0–99)
Total CHOL/HDL Ratio: 3 Ratio
Triglycerides: 135 mg/dL (ref <150)
VLDL: 27 mg/dL (ref 0–40)

## 2014-06-23 LAB — HEPATIC FUNCTION PANEL
ALT: 18 U/L (ref 0–53)
AST: 17 U/L (ref 0–37)
Albumin: 4.1 g/dL (ref 3.5–5.2)
Alkaline Phosphatase: 84 U/L (ref 39–117)
Bilirubin, Direct: 0.1 mg/dL (ref 0.0–0.3)
Indirect Bilirubin: 0.3 mg/dL (ref 0.2–1.2)
Total Bilirubin: 0.4 mg/dL (ref 0.2–1.2)
Total Protein: 7.2 g/dL (ref 6.0–8.3)

## 2014-06-23 LAB — BASIC METABOLIC PANEL WITH GFR
BUN: 9 mg/dL (ref 6–23)
CO2: 29 mEq/L (ref 19–32)
Calcium: 9.6 mg/dL (ref 8.4–10.5)
Chloride: 103 mEq/L (ref 96–112)
Creat: 0.64 mg/dL (ref 0.50–1.35)
GFR, Est African American: >89 mL/min
GFR, Est Non African American: >89 mL/min
Glucose, Bld: 81 mg/dL (ref 70–99)
Potassium: 4.5 mEq/L (ref 3.5–5.3)
Sodium: 139 mEq/L (ref 135–145)

## 2014-06-23 LAB — MAGNESIUM: Magnesium: 2.2 mg/dL (ref 1.5–2.5)

## 2014-06-23 LAB — TSH: TSH: 0.962 u[IU]/mL (ref 0.350–4.500)

## 2014-06-23 MED ORDER — LISINOPRIL 20 MG PO TABS: ORAL_TABLET | ORAL | Status: AC

## 2014-06-23 MED ORDER — SILDENAFIL CITRATE 20 MG PO TABS: ORAL_TABLET | ORAL | Status: DC

## 2014-06-23 MED ORDER — SILDENAFIL CITRATE 20 MG PO TABS
ORAL_TABLET | ORAL | Status: DC
Start: 2014-06-23 — End: 2014-06-23

## 2014-06-23 MED ORDER — BISOPROLOL-HYDROCHLOROTHIAZIDE 5-6.25 MG PO TABS: ORAL_TABLET | ORAL | Status: AC

## 2014-06-23 NOTE — Progress Notes (Signed)
Patient ID: Victor Pham, male   DOB: 07-Jun-1950, 64 y.o.   MRN: [id number]   This very nice 64 y.o. MWM presents for 3 month follow up with Hypertension, Hyperlipidemia, Pre-Diabetes, Vitamin D Deficiency and non compliance.    Patient has labile HTN & BP and has not monitored at home as recommended.  Patient declined to start recommended medications again at last OV. . Today's BP is elevated at 158/98 and rechecked at 160/106. Patient has had no complaints of any cardiac type chest pain, palpitations, dyspnea/orthopnea/PND, dizziness, claudication, or dependent edema. Patient does not exercise & continues to smoke cigarettes.    Hyperlipidemia is controlled with diet & meds. Patient denies myalgias or other med SE's. Last Lipids were Total Chol 155; HDL 68; LDL  73; Trig 70 on 04/12/2014.   Also, the patient has history of PreDiabetes and has had no symptoms of reactive hypoglycemia, diabetic polys, paresthesias or visual blurring.  Last A1c was  5.9% on 04/12/2014.   Further, the patient also has history of Vitamin D Deficiency of 29 in 2009 and possible supplements vitamin D. Last vitamin D was extremely low at "11" on 04/12/2014.     PATIENT IS UNCERTAIN WHAT MEDICINES HE'S TAKING AND WITH A "SMILE" SAY ASK MY WIFE.   Medication Sig  . albuterol (PROVENTIL HFA;VENTOLIN HFA) 108 (90 BASE) MCG/ACT inhaler Inhale 1-2 puffs into the lungs every 6 (six) hours as needed for wheezing or shortness of breath.  Marland Kitchen aspirin 81 MG tablet Take 81 mg by mouth daily.  Marland Kitchen VITAMIN D 5, 000 UNITS  Take 4,000 Units by mouth QID. - patient not certain if taking  . clonazePAM (KLONOPIN) 2 MG tablet Take 1 tablet (2 mg total) by mouth 3 (three) times daily.  . rosuvastatin (CRESTOR) 40 MG tablet TAKE 1 TABLET (40 MG TOTAL) BY MOUTH DAILY.  . tadalafil (CIALIS) 20 MG tablet Take 20 mg by mouth.  . Vitamin D 50,000 UNITS CAPS  Take 1 capsule (50,000 Units total) by mouth every 7 (seven) days. - Patient not certain if taking   . citalopram (CELEXA) 40 MG tablet TAKE 1 TABLET BY MOUTH EVERY DAY  . ANORO ELLIPTA 62.5-25 MCG/INH  1 puff once dialy   Allergies  Allergen Reactions  . Lipitor [Atorvastatin]   . Penicillins     REACTION: hives   PMHx:   Past Medical History  Diagnosis Date  . Personal history of adenomatous colonic polyps 12/28/2011    01/2009 = 3 diminutive adenomas   . Hypertension   . Hyperlipidemia   . Urticaria   . Pre-diabetes   . Cataract     removed  . Depression   . Anxiety    Immunization History  Administered Date(s) Administered  . PPD Test 12/15/2013  . Pneumococcal Polysaccharide-23 12/06/2008  . Td 06/13/2003   Past Surgical History  Procedure Laterality Date  . Colonoscopy    . Prostate surgery  2007    Biopsy/Dr. Risa Grill  . Eye surgery    . Cataract extraction w/ intraocular lens  implant, bilateral Bilateral 2010    Dr. Waynetta Sandy   FHx:    Reviewed / unchanged  SHx:    Reviewed / unchanged  Systems Review:  Constitutional: Denies fever, chills, wt changes, headaches, insomnia, fatigue, night sweats, change in appetite. Eyes: Denies redness, blurred vision, diplopia, discharge, itchy, watery eyes.  ENT: Denies discharge, congestion, post nasal drip, epistaxis, sore throat, earache, hearing loss, dental pain, tinnitus, vertigo, sinus pain, snoring.  CV: Denies chest pain, palpitations, irregular heartbeat, syncope, dyspnea, diaphoresis, orthopnea, PND, claudication or edema. Respiratory: denies cough, dyspnea, DOE, pleurisy, hoarseness, laryngitis, wheezing.  Gastrointestinal: Denies dysphagia, odynophagia, heartburn, reflux, water brash, abdominal pain or cramps, nausea, vomiting, bloating, diarrhea, constipation, hematemesis, melena, hematochezia  or hemorrhoids. Genitourinary: Denies dysuria, frequency, urgency, nocturia, hesitancy, discharge, hematuria or flank pain. Musculoskeletal: Denies arthralgias, myalgias, stiffness, jt. swelling, pain, limping or  strain/sprain.  Skin: Denies pruritus, rash, hives, warts, acne, eczema or change in skin lesion(s). Neuro: No weakness, tremor, incoordination, spasms, paresthesia or pain. Psychiatric: Denies confusion, memory loss or sensory loss. Endo: Denies change in weight, skin or hair change.  Heme/Lymph: No excessive bleeding, bruising or enlarged lymph nodes.  Physical Exam  BP 158/98 & rechecked 164/ 106  Pulse 92  Temp 97.7 F   Resp 16  Ht 6' 2.5"   Wt 231 lb 6.4 oz     BMI 29.32  Appears well nourished and in no distress. Eyes: PERRLA, EOMs, conjunctiva no swelling or erythema. Sinuses: No frontal/maxillary tenderness ENT/Mouth: EAC's clear, TM's nl w/o erythema, bulging. Nares clear w/o erythema, swelling, exudates. Oropharynx clear without erythema or exudates. Oral hygiene is good. Tongue normal, non obstructing. Hearing intact.  Neck: Supple. Thyroid nl. Car 2+/2+ without bruits, nodes or JVD. Chest: Respirations nl with BS clear & equal w/o rales, rhonchi, wheezing or stridor.  Cor: Heart sounds normal w/ regular rate and rhythm without sig. murmurs, gallops, clicks, or rubs. Peripheral pulses normal and equal  without edema.  Abdomen: Soft & bowel sounds normal. Non-tender w/o guarding, rebound, hernias, masses, or organomegaly.  Lymphatics: Unremarkable.  Musculoskeletal: Full ROM all peripheral extremities, joint stability, 5/5 strength, and normal gait.  Skin: Warm, dry without exposed rashes, lesions or ecchymosis apparent.  Neuro: Cranial nerves intact, reflexes equal bilaterally. Sensory-motor testing grossly intact. Tendon reflexes grossly intact.  Pysch: Alert & oriented x 3.  Insight and judgement nl & appropriate. No ideations.  Assessment and Plan:  1. Essential hypertension  - TSH - bisoprolol-hydrochlorothiazide (ZIAC) 5-6.25 MG per tablet; Take 1 tablet every morning for BP  Dispense: 90 tablet; Refill: 1 - lisinopril (PRINIVIL,ZESTRIL) 20 MG tablet; Take 1  tablet at Bedtime for BP  Dispense: 90 tablet; Refill: 1  - Note: patient is very adverse to beginning meds.  2. Hyperlipidemia  - Lipid panel -ROV 1 mon to recheck  3. Pre-diabetes  - Hemoglobin A1c - Insulin, random  4. Vitamin D deficiency  - Vit D  25 hydroxy   5. Erectile dysfunction,   - sildenafil (REVATIO) 20 MG tablet; Take 1 to 5 pills qd as needed for XXXX  Dispense: 30 tablet; Refill: 99  6. Tobacco use disorder  - counseled [id number] of smoking cessation and as previous, patient declines pharmacologic interventions.   7. Medication management  - CBC with Differential/Platelet - BASIC METABOLIC PANEL WITH GFR - Hepatic function panel - Magnesium  8. Poor Compliance -   Recommended regular exercise, BP monitoring, weight control, and discussed med and SE's. Recommended labs to assess and monitor clinical status. Further disposition pending results of labs. Over 30 minutes of exam, counseling, chart review was performed

## 2014-06-23 NOTE — Patient Instructions (Signed)
  Recommend Adult Low dose Aspirin or baby Aspirin 81 mg daily   To reduce risk of Colon Cancer 20 %,   Skin Cancer 26 % ,   Melanoma 46%   and   Pancreatic cancer 60%  ++++++++++++++++++  Vitamin D goal is between 70-100.   Please make sure that you are taking your Vitamin D as directed.   It is very important as a natural anti-inflammatory   helping hair, skin, and nails, as well as reducing stroke and heart attack risk.   It helps your bones and helps with mood.  It also decreases numerous cancer risks so please take it as directed.   Low Vit D is associated with a 200-300% higher risk for CANCER   and 200-300% higher risk for HEART   ATTACK  &  STROKE.    ......................................  It is also associated with higher death rate at younger ages,   autoimmune diseases like Rheumatoid arthritis, Lupus, Multiple Sclerosis.     Also many other serious conditions, like depression, Alzheimer's  Dementia, infertility, muscle aches, fatigue, fibromyalgia - just to name a few.  +++++++++++++++++++    Recommend the book "The END of DIETING" by Dr Joel Fuhrman   & the book "The END of DIABETES " by Dr Joel Fuhrman  At [employer].com - get book & Audio CD's     Being diabetic has a  300% increased risk for heart attack, stroke, cancer, and alzheimer- type vascular dementia. It is very important that you work harder with diet by avoiding all foods that are Arya. Avoid [place] rice (brown & wild rice is OK), [place] potatoes (sweetpotatoes in moderation is OK), [place] bread or wheat bread or anything made out of [place] flour like bagels, donuts, rolls, buns, biscuits, cakes, pastries, cookies, pizza crust, and pasta (made from [place] flour & egg whites) - vegetarian pasta or spinach or wheat pasta is OK. Multigrain breads like Arnold's or Pepperidge Farm, or multigrain sandwich thins or flatbreads.  Diet, exercise and weight loss can reverse and cure diabetes in the early  stages.  Diet, exercise and weight loss is very important in the control and prevention of complications of diabetes which affects every system in your body, ie. Brain - dementia/stroke, eyes - glaucoma/blindness, heart - heart attack/heart failure, kidneys - dialysis, stomach - gastric paralysis, intestines - malabsorption, nerves - severe painful neuritis, circulation - gangrene & loss of a leg(s), and finally cancer and Alzheimers.    I recommend avoid fried & greasy foods,  sweets/candy, [place] rice (brown or wild rice or Quinoa is OK), [place] potatoes (sweet potatoes are OK) - anything made from [place] flour - bagels, doughnuts, rolls, buns, biscuits,Tarbet and wheat breads, pizza crust and traditional pasta made of [place] flour & egg Gaubert(vegetarian pasta or spinach or wheat pasta is OK).  Multi-grain bread is OK - like multi-grain flat bread or sandwich thins. Avoid alcohol in excess. Exercise is also important.    Eat all the vegetables you want - avoid meat, especially red meat and dairy - especially cheese.  Cheese is the most concentrated form of trans-fats which is the worst thing to clog up our arteries. Veggie cheese is OK which can be found in the fresh produce section at [employer] or [employer] or [employer]  ++++++++++++++++++++++++++ . 

## 2014-06-24 LAB — HEMOGLOBIN A1C
Hgb A1c MFr Bld: 5.8 % — ABNORMAL HIGH (ref <5.7)
Mean Plasma Glucose: 120 mg/dL — ABNORMAL HIGH (ref <117)

## 2014-06-24 LAB — INSULIN, RANDOM: Insulin: 5.5 u[IU]/mL (ref 2.0–19.6)

## 2014-06-24 LAB — VITAMIN D 25 HYDROXY (VIT D DEFICIENCY, FRACTURES): Vit D, 25-Hydroxy: 26 ng/mL — ABNORMAL LOW (ref 30–100)

## 2014-07-01 ENCOUNTER — Other Ambulatory Visit: Payer: Self-pay | Admitting: Internal Medicine

## 2014-07-07 ENCOUNTER — Other Ambulatory Visit: Payer: Self-pay | Admitting: Internal Medicine

## 2014-07-13 ENCOUNTER — Other Ambulatory Visit: Payer: Self-pay | Admitting: Internal Medicine

## 2014-07-14 ENCOUNTER — Encounter: Payer: Self-pay | Admitting: Internal Medicine

## 2014-07-16 ENCOUNTER — Other Ambulatory Visit: Payer: Self-pay | Admitting: Emergency Medicine

## 2014-08-04 ENCOUNTER — Ambulatory Visit: Payer: Self-pay | Admitting: Internal Medicine

## 2014-11-30 ENCOUNTER — Encounter: Payer: Self-pay | Admitting: Emergency Medicine

## 2014-11-30 ENCOUNTER — Ambulatory Visit
Admission: EM | Admit: 2014-11-30 | Discharge: 2014-11-30 | Disposition: A | Discharge status: Home / Self Care | Payer: 59 | Attending: Family Medicine | Admitting: Family Medicine

## 2014-11-30 DIAGNOSIS — T1592XA Foreign body on external eye, part unspecified, left eye, initial encounter: Secondary | ICD-10-CM

## 2014-11-30 DIAGNOSIS — H109 Unspecified conjunctivitis: Secondary | ICD-10-CM

## 2014-11-30 MED ORDER — ERYTHROMYCIN 5 MG/GM OP OINT: TOPICAL_OINTMENT | OPHTHALMIC | Status: DC

## 2014-11-30 MED ORDER — HYDROCODONE-ACETAMINOPHEN 5-325 MG PO TABS: 1.0000 | ORAL_TABLET | Freq: Three times a day (TID) | ORAL | Status: DC | PRN

## 2014-11-30 NOTE — Discharge Instructions (Signed)
Bacterial Conjunctivitis Bacterial conjunctivitis (commonly called pink eye) is redness, soreness, or puffiness (inflammation) of the [place] part of your eye. It is caused by a germ called bacteria. These germs can easily spread from person to person (contagious). Your eye often will become red or pink. Your eye may also become irritated, watery, or have a thick discharge.  HOME CARE   Apply a cool, clean washcloth over closed eyelids. Do this for 10-20 minutes, 3-4 times a day while you have pain.  Gently wipe away any fluid coming from the eye with a warm, wet washcloth or cotton ball.  Wash your hands often with soap and water. Use paper towels to dry your hands.  Do not share towels or washcloths.  Change or wash your pillowcase every day.  Do not use eye makeup until the infection is gone.  Do not use machines or drive if your vision is blurry.  Stop using contact lenses. Do not use them again until your doctor says it is okay.  Do not touch the tip of the eye drop bottle or medicine tube with your fingers when you put medicine on the eye. GET HELP RIGHT AWAY IF:   Your eye is not better after 3 days of starting your medicine.  You have a yellowish fluid coming out of the eye.  You have more pain in the eye.  Your eye redness is spreading.  Your vision becomes blurry.  You have a fever or lasting symptoms for more than 2-3 days.  You have a fever and your symptoms suddenly get worse.  You have pain in the face.  Your face gets red or puffy (swollen). MAKE SURE YOU:   Understand these instructions.  Will watch this condition.  Will get help right away if you are not doing well or get worse.   This information is not intended to replace advice given to you by your health care provider. Make sure you discuss any questions you have with your health care provider.   Document Released: 10/30/2007 Document Revised: 01/07/2012 Document Reviewed: 09/26/2011 Elsevier  Interactive Patient Education 2016 [place] Foreign Body A foreign body is an object on or in the eye that should not be there. The object could be a speck of dirt or dust, a hair, an eyelash, a splinter, or any other object. HOME CARE  Take medicines only as told by your doctor. Use eye drops or ointment as told.  If no eye patch was put on:  Keep the eye closed as much as possible.  Do not rub the eye.  Wear dark glasses in bright light.  Do not wear contact lenses until the eye feels normal, or as told by your doctor.  Wear protective eye covering when needed, especially when using high-speed tools.  If your eye is patched:  Follow your doctor's instructions for when to remove the patch.  Do notdrive or use machines while the eye patch is on. Judging distances is hard to do while wearing a patch.  Keep all follow-up visits as told by your doctor. This is important. GET HELP IF:   Your pain gets worse.  Your vision gets worse.  You have problems with your eye patch.  You have fluid (discharge) coming from your eye.  You have redness and swelling around your eye. MAKE SURE YOU:   Understand these instructions.  Will watch your condition.  Will get help right away if you are not doing well or get  worse.   This information is not intended to replace advice given to you by your health care provider. Make sure you discuss any questions you have with your health care provider.   Document Released: 07/10/2009 Document Revised: 02/10/2014 Document Reviewed: 06/17/2012 Elsevier Interactive Patient Education [employer].

## 2014-11-30 NOTE — ED Provider Notes (Signed)
CSN: [id number]     Arrival date & time 11/30/14  1003 History   First MD Initiated Contact with Patient 11/30/14 1056    Nurses notes were reviewed. Chief Complaint  Patient presents with  . Foreign Body in Eye    Patient reports getting something in his eye or feeling as though something in his eye this morning when he woke. He states later that was after his morning coffee that he felt something in his eye. He tried irrigating his left eye and thought that he got something out but then by the time he drove from [place] to this area on his way to [place] he felt like something was back in his eyes he stopped to get treatment. He has informed me several times that he has never had anything in his eyes before. He has also had cataract surgery before.  (Consider location/radiation/quality/duration/timing/severity/associated sxs/prior Treatment) Patient is a 64 y.o. male presenting with foreign body in eye. The history is provided by the patient. No language interpreter was used.  Foreign Body in Eye This is a new problem. The current episode started 3 to 5 hours ago. The problem occurs constantly. Pertinent negatives include no chest pain, no abdominal pain, no headaches and no shortness of breath. Nothing relieves the symptoms. The treatment provided no relief.    Past Medical History  Diagnosis Date  . Personal history of adenomatous colonic polyps 12/28/2011    01/2009 = 3 diminutive adenomas   . Hypertension   . Hyperlipidemia   . Urticaria   . Pre-diabetes   . Cataract     removed  . Depression   . Anxiety    Past Surgical History  Procedure Laterality Date  . Colonoscopy    . Prostate surgery  2007    Biopsy/Dr. Risa Grill  . Eye surgery    . Cataract extraction w/ intraocular lens  implant, bilateral Bilateral 2010    Dr. Waynetta Sandy   Family History  Problem Relation Age of Onset  . Asthma Mother   . Hypertension Father   . Colon cancer Neg Hx   . Pancreatic cancer Neg  Hx   . Stomach cancer Neg Hx   . Esophageal cancer Neg Hx   . Rectal cancer Neg Hx   . Prostate cancer Neg Hx    Social History  Substance Use Topics  . Smoking status: Current Every Day Smoker -- 1.50 packs/day  . Smokeless tobacco: Never Used  . Alcohol Use: 1.2 oz/week    2 Cans of beer per week    Review of Systems  Eyes: Positive for photophobia, pain and redness.  Respiratory: Negative for shortness of breath.   Cardiovascular: Negative for chest pain.  Gastrointestinal: Negative for abdominal pain.  Neurological: Negative for headaches.    Allergies  Lipitor and Penicillins  Home Medications   Prior to Admission medications   Medication Sig Start Date End Date Taking? Authorizing Provider  albuterol (PROVENTIL HFA;VENTOLIN HFA) 108 (90 BASE) MCG/ACT inhaler Inhale 1-2 puffs into the lungs every 6 (six) hours as needed for wheezing or shortness of breath. 04/12/14   Vicie Mutters, PA-C  aspirin 81 MG tablet Take 81 mg by mouth daily.    Historical Provider, MD  bisoprolol-hydrochlorothiazide [facility]) 5-6.25 MG per tablet Take 1 tablet every morning for BP 06/23/14 12/24/14  Unk Pinto, MD  cholecalciferol (VITAMIN D) 1000 UNITS tablet Take 4,000 Units by mouth QID.    Historical Provider, MD  citalopram (CELEXA) 40 MG tablet TAKE  1 TABLET BY MOUTH EVERY DAY INS ALLOWS 30 DAY 07/16/14   Unk Pinto, MD  clonazePAM (KLONOPIN) 2 MG tablet TAKE 1 TABLET BY MOUTH 3 TIMES DAILY 07/13/14   Vicie Mutters, PA-C  CRESTOR 40 MG tablet TAKE 1 TABLET BY MOUTH DAILY 07/01/14   Unk Pinto, MD  CRESTOR 40 MG tablet TAKE 1 TABLET BY MOUTH DAILY 07/08/14   Unk Pinto, MD  lisinopril (PRINIVIL,ZESTRIL) 20 MG tablet Take 1 tablet at Bedtime for BP 06/23/14 12/24/14  Unk Pinto, MD  sildenafil (REVATIO) 20 MG tablet Take 1 to 5 pills qd as needed for XXXX 06/23/14   Unk Pinto, MD  Vitamin D, Ergocalciferol, (DRISDOL) 50000 UNITS CAPS capsule Take 1 capsule (50,000 Units  total) by mouth every 7 (seven) days. 04/13/14   Vicie Mutters, PA-C   Meds Ordered and Administered this Visit  Medications - No data to display  BP 130/81 mmHg  Pulse 78  Temp(Src) 97.8 F (36.6 C) (Tympanic)  Resp 16  Ht 6\' 4"  (1.93 m)  Wt 230 lb (104.327 kg)  BMI 28.01 kg/m2  SpO2 98% No data found.   Physical Exam  Constitutional: He is oriented to person, place, and time. He appears well-nourished.  HENT:  Head: Normocephalic and atraumatic.  Eyes: Conjunctivae are normal. Pupils are equal, round, and reactive to light. Scleral icterus is present.  Neck: Normal range of motion. Neck supple.  Musculoskeletal: Normal range of motion.  Neurological: He is alert and oriented to person, place, and time.  Skin: Skin is warm and dry.  Psychiatric: He has a normal mood and affect.  Vitals reviewed.   ED Course  .Foreign Body Removal Date/Time: 11/30/2014 11:45 AM Performed by: Frederich Cha Authorized by: Frederich Cha Consent: Verbal consent obtained. Body area: eye Location details: left conjunctiva Local anesthetic: tetracaine drops Patient sedated: no Patient restrained: no Patient cooperative: yes Localization method: eyelid eversion and magnification Removal mechanism: irrigation and moist cotton swab Eye examined with fluorescein. Corneal abrasion size: No coronary abrasion was actually noted there was some blood when the upper eyelid was inverted and his request most back of material seen but when I was reinserted and was skin wiped no further bleeding was noted. No residual rust ring present. Dressing: antibiotic ointment 1 objects recovered. Objects recovered: Small black particle undetermined exactly what it was Post-procedure assessment: foreign body removed Patient tolerance: Patient tolerated the procedure well with no immediate complications Comments: Patient expressed a lot of concern during the procedure and afterwards will irrigate the eye just to  make sure that there is nothing else underneath eyelid will place him on erythromycin ointment hydrocodone for pain to use when necessary   (including critical care time)  Labs Review Labs Reviewed - No data to display  Imaging Review No results found.   Visual Acuity Review  Right Eye Distance: 20/30 uncorrected Left Eye Distance: 20/40 uncorrected Bilateral Distance:    Right Eye Near:   Left Eye Near:    Bilateral Near:         MDM  No diagnosis found.   Is not better by tomorrow please seek ophthalmological evaluation. We'll place on erythromycin ointment and hydrocodone for pain.  Frederich Cha, MD 11/30/14 [phone]

## 2014-11-30 NOTE — ED Notes (Signed)
Patient states that "there is something moving around in his left eye."  Patient states that is started this morning and now has some irritation pain in his left eye.

## 2014-12-22 ENCOUNTER — Encounter: Payer: Self-pay | Admitting: Internal Medicine

## 2014-12-29 ENCOUNTER — Other Ambulatory Visit: Payer: Self-pay | Admitting: Internal Medicine

## 2016-10-15 ENCOUNTER — Emergency Department (HOSPITAL_COMMUNITY)
Admission: EM | Admit: 2016-10-15 | Discharge: 2016-10-15 | Disposition: A | Discharge status: Home / Self Care | Payer: 59 | Attending: Emergency Medicine | Admitting: Emergency Medicine

## 2016-10-15 ENCOUNTER — Encounter (HOSPITAL_COMMUNITY): Payer: Self-pay | Admitting: Emergency Medicine

## 2016-10-15 ENCOUNTER — Other Ambulatory Visit: Payer: Self-pay

## 2016-10-15 DIAGNOSIS — J449 Chronic obstructive pulmonary disease, unspecified: Secondary | ICD-10-CM | POA: Diagnosis not present

## 2016-10-15 DIAGNOSIS — Z79899 Other long term (current) drug therapy: Secondary | ICD-10-CM | POA: Insufficient documentation

## 2016-10-15 DIAGNOSIS — F17200 Nicotine dependence, unspecified, uncomplicated: Secondary | ICD-10-CM | POA: Insufficient documentation

## 2016-10-15 DIAGNOSIS — I10 Essential (primary) hypertension: Secondary | ICD-10-CM | POA: Diagnosis not present

## 2016-10-15 DIAGNOSIS — E785 Hyperlipidemia, unspecified: Secondary | ICD-10-CM | POA: Insufficient documentation

## 2016-10-15 DIAGNOSIS — R7303 Prediabetes: Secondary | ICD-10-CM | POA: Diagnosis not present

## 2016-10-15 DIAGNOSIS — H538 Other visual disturbances: Secondary | ICD-10-CM | POA: Diagnosis present

## 2016-10-15 DIAGNOSIS — H16103 Unspecified superficial keratitis, bilateral: Secondary | ICD-10-CM | POA: Diagnosis not present

## 2016-10-15 LAB — COMPREHENSIVE METABOLIC PANEL
ALT: 26 U/L (ref 17–63)
AST: 26 U/L (ref 15–41)
Albumin: 3.8 g/dL (ref 3.5–5.0)
Alkaline Phosphatase: 56 U/L (ref 38–126)
Anion gap: 6 (ref 5–15)
BUN: 11 mg/dL (ref 6–20)
CO2: 26 mmol/L (ref 22–32)
Calcium: 9 mg/dL (ref 8.9–10.3)
Chloride: 103 mmol/L (ref 101–111)
Creatinine, Ser: 0.84 mg/dL (ref 0.61–1.24)
GFR calc Af Amer: >60 mL/min (ref >60)
GFR calc non Af Amer: >60 mL/min (ref >60)
Glucose, Bld: 95 mg/dL (ref 65–99)
Potassium: 3.4 mmol/L — ABNORMAL LOW (ref 3.5–5.1)
Sodium: 135 mmol/L (ref 135–145)
Total Bilirubin: 0.6 mg/dL (ref 0.3–1.2)
Total Protein: 6.5 g/dL (ref 6.5–8.1)

## 2016-10-15 LAB — CBC
HCT: 43.9 % (ref 39.0–52.0)
Hemoglobin: 14.6 g/dL (ref 13.0–17.0)
MCH: 32.7 pg (ref 26.0–34.0)
MCHC: 33.3 g/dL (ref 30.0–36.0)
MCV: 98.4 fL (ref 78.0–100.0)
Platelets: 166 10*3/uL (ref 150–400)
RBC: 4.46 MIL/uL (ref 4.22–5.81)
RDW: 13.1 % (ref 11.5–15.5)
WBC: 5.6 10*3/uL (ref 4.0–10.5)

## 2016-10-15 MED ORDER — ERYTHROMYCIN 5 MG/GM OP OINT: TOPICAL_OINTMENT | OPHTHALMIC | 0 refills | Status: DC

## 2016-10-15 MED ORDER — TETRACAINE HCL 0.5 % OP SOLN
2.0000 [drp] | Freq: Once | OPHTHALMIC | Status: AC
  Administered 2016-10-15: 2 [drp] via OPHTHALMIC
  Filled 2016-10-15: qty 4

## 2016-10-15 MED ORDER — FLUORESCEIN SODIUM 0.6 MG OP STRP
1.0000 | ORAL_STRIP | Freq: Once | OPHTHALMIC | Status: AC
  Administered 2016-10-15: 1 via OPHTHALMIC
  Filled 2016-10-15: qty 1

## 2016-10-15 NOTE — ED Provider Notes (Signed)
[facility] DEPT Provider Note   CSN: [id number] Arrival date & time: 10/15/16  0102     History   Chief Complaint Chief Complaint  Patient presents with  . Blurred Vision    HPI Victor Pham is a 66 y.o. male.  HPI Patient presents to the emergency department with bilateral eye irritation and blurred vision after an episode at work where the machine working on had a wire that arced causing a sudden bright light.  The patient states he is not wearing any protective eyewear only safety glasses.  The patient states that the irritation is present since that episode patient states he does not have any headache, nausea, vomiting, weakness, dizziness, neck pain, shortness of breath, near syncope or syncope.  Patient states he did not take any medications prior to arrival Past Medical History:  Diagnosis Date  . Anxiety   . Cataract    removed  . Depression   . Hyperlipidemia   . Hypertension   . Personal history of adenomatous colonic polyps 12/28/2011   01/2009 = 3 diminutive adenomas   . Pre-diabetes   . Urticaria     Patient Active Problem List   Diagnosis Date Noted  . COPD 04/12/2014  . Tobacco use disorder 04/12/2014  . Vitamin D deficiency 07/21/2013  . Medication management 07/21/2013  . Poor compliance with medication 07/21/2013  . Hypertension   . Hyperlipidemia   . Pre-diabetes   . Depression, controlled   . Anxiety   . Personal history of adenomatous colonic polyps 12/28/2011    Past Surgical History:  Procedure Laterality Date  . CATARACT EXTRACTION W/ INTRAOCULAR LENS  IMPLANT, BILATERAL Bilateral 2010   Dr. Waynetta Sandy  . COLONOSCOPY    . EYE SURGERY    . PROSTATE SURGERY  2007   Biopsy/Dr. Alexander Medications    Prior to Admission medications   Medication Sig Start Date End Date Taking? Authorizing Provider  albuterol (PROVENTIL HFA;VENTOLIN HFA) 108 (90 BASE) MCG/ACT inhaler Inhale 1-2 puffs into the lungs every 6 (six) hours as  needed for wheezing or shortness of breath. 04/12/14  Yes Vicie Mutters, PA-C  bisoprolol-hydrochlorothiazide [facility]) 5-6.25 MG per tablet Take 1 tablet every morning for BP 06/23/14 10/15/16 Yes Unk Pinto, MD  clonazePAM (KLONOPIN) 2 MG tablet TAKE 1 TABLET BY MOUTH 3 TIMES DAILY 07/13/14  Yes Vicie Mutters, PA-C  CRESTOR 40 MG tablet TAKE 1 TABLET BY MOUTH DAILY Patient taking differently: TAKE 1 TABLET BY MOUTH bedtime 07/01/14  Yes Unk Pinto, MD  FLUoxetine (PROZAC) 10 MG capsule Take 10 mg by mouth daily. 09/08/16 09/08/17 Yes [provider]  lisinopril (PRINIVIL,ZESTRIL) 20 MG tablet Take 20 mg by mouth at bedtime. 03/25/16  Yes [provider]  sildenafil (REVATIO) 20 MG tablet Take 1 to 5 pills qd as needed for XXXX 06/23/14  Yes Unk Pinto, MD  aspirin 81 MG tablet Take 81 mg by mouth daily.    [provider]  cholecalciferol (VITAMIN D) 1000 UNITS tablet Take 4,000 Units by mouth QID.    [provider]  citalopram (CELEXA) 40 MG tablet TAKE 1 TABLET BY MOUTH EVERY DAY INS ALLOWS 30 DAY 07/16/14   Unk Pinto, MD  CRESTOR 40 MG tablet TAKE 1 TABLET BY MOUTH DAILY 07/08/14   Unk Pinto, MD  erythromycin ophthalmic ointment Place a 1/2 inch ribbon of ointment into the lower eyelid. Three times a day. 11/30/14   Frederich Cha, MD  HYDROcodone-acetaminophen (  NORCO) 5-325 MG tablet Take 1 tablet by mouth every 8 (eight) hours as needed for moderate pain. 11/30/14   Frederich Cha, MD  Vitamin D, Ergocalciferol, (DRISDOL) 50000 UNITS CAPS capsule Take 1 capsule (50,000 Units total) by mouth every 7 (seven) days. 04/13/14   Vicie Mutters, PA-C    Family History Family History  Problem Relation Age of Onset  . Asthma Mother   . Hypertension Father   . Colon cancer Neg Hx   . Pancreatic cancer Neg Hx   . Stomach cancer Neg Hx   . Esophageal cancer Neg Hx   . Rectal cancer Neg Hx   . Prostate cancer Neg Hx     Social History Social  History  Substance Use Topics  . Smoking status: Current Every Day Smoker    Packs/day: 1.50  . Smokeless tobacco: Never Used  . Alcohol use 1.2 oz/week    2 Cans of beer per week     Allergies   Lipitor [atorvastatin] and Penicillins   Review of Systems Review of Systems All other systems negative except as documented in the HPI. All pertinent positives and negatives as reviewed in the HPI.  Physical Exam Updated Vital Signs BP 136/71   Pulse 77   Temp 97.6 F (36.4 C) (Oral)   Resp (!) 21   Ht 6\' 4"  (1.93 m)   Wt 111.1 kg (245 lb)   SpO2 94%   BMI 29.82 kg/m   Physical Exam  Constitutional: He is oriented to person, place, and time. He appears well-developed and well-nourished. No distress.  HENT:  Head: Normocephalic and atraumatic.  Eyes: Pupils are equal, round, and reactive to light. Right conjunctiva is injected. Left conjunctiva is injected.  Slit lamp exam:      The right eye shows fluorescein uptake.       The left eye shows fluorescein uptake.  Pulmonary/Chest: Effort normal.  Neurological: He is alert and oriented to person, place, and time.  Skin: Skin is warm and dry.  Psychiatric: He has a normal mood and affect.  Nursing note and vitals reviewed.    ED Treatments / Results  Labs (all labs ordered are listed, but only abnormal results are displayed) Labs Reviewed  COMPREHENSIVE METABOLIC PANEL - Abnormal; Notable for the following:       Result Value   Potassium 3.4 (*)    All other components within normal limits  CBC    EKG  EKG Interpretation None       Radiology No results found.  Procedures Procedures (including critical care time)  Medications Ordered in ED Medications  fluorescein ophthalmic strip 1 strip (not administered)  tetracaine (PONTOCAINE) 0.5 % ophthalmic solution 2 drop (not administered)     Initial Impression / Assessment and Plan / ED Course  I have reviewed the triage vital signs and the nursing  notes.  Pertinent labs & imaging results that were available during my care of the patient were reviewed by me and considered in my medical decision making (see chart for details).     Patient most likely has an injury similar to welders eye based on the mechanism of the injury.  Patient will be sent to ophthalmology for close follow-up.  Patient agrees the plan and all questions were answered.  There is no foreign body noted  Final Clinical Impressions(s) / ED Diagnoses   Final diagnoses:  None    New Prescriptions New Prescriptions   No medications on file  Dalia Heading, PA-C 10/20/16 1405    Mesner, Corene Cornea, MD 10/20/16 1538

## 2016-10-15 NOTE — Discharge Instructions (Signed)
Return here as needed. Follow up with the eye doctor provided by calling them when you leave here.

## 2016-10-15 NOTE — ED Notes (Signed)
Pt sate he understands instructions and home stable with steady gait.

## 2016-10-15 NOTE — ED Notes (Signed)
Consulted Dr. Stark Jock regarding patient's s/s, see orders.
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
## 2016-10-15 NOTE — ED Notes (Signed)
Pt oob to br with steady gait 

## 2018-04-07 ENCOUNTER — Encounter: Payer: Self-pay | Admitting: Internal Medicine

## 2018-07-28 ENCOUNTER — Telehealth: Payer: Self-pay | Admitting: *Deleted

## 2018-07-28 NOTE — Telephone Encounter (Signed)
Call placed to mobile and home phone message left at each voice mail.

## 2018-07-28 NOTE — Telephone Encounter (Signed)
Pt. Was no show procedure cancelled and mailed no show letter.

## 2018-08-09 ENCOUNTER — Encounter: Payer: 59 | Admitting: Internal Medicine

## 2019-12-22 ENCOUNTER — Emergency Department (HOSPITAL_COMMUNITY)
Admission: EM | Admit: 2019-12-22 | Discharge: 2019-12-22 | Disposition: A | Discharge status: Home / Self Care | Payer: Medicare Other | Attending: Emergency Medicine | Admitting: Emergency Medicine

## 2019-12-22 ENCOUNTER — Encounter (HOSPITAL_COMMUNITY): Payer: Self-pay | Admitting: *Deleted

## 2019-12-22 ENCOUNTER — Other Ambulatory Visit: Payer: Self-pay

## 2019-12-22 DIAGNOSIS — J449 Chronic obstructive pulmonary disease, unspecified: Secondary | ICD-10-CM | POA: Insufficient documentation

## 2019-12-22 DIAGNOSIS — I10 Essential (primary) hypertension: Secondary | ICD-10-CM | POA: Diagnosis not present

## 2019-12-22 DIAGNOSIS — F17200 Nicotine dependence, unspecified, uncomplicated: Secondary | ICD-10-CM | POA: Insufficient documentation

## 2019-12-22 DIAGNOSIS — Z79899 Other long term (current) drug therapy: Secondary | ICD-10-CM | POA: Diagnosis not present

## 2019-12-22 DIAGNOSIS — R040 Epistaxis: Secondary | ICD-10-CM | POA: Diagnosis present

## 2019-12-22 DIAGNOSIS — Z7982 Long term (current) use of aspirin: Secondary | ICD-10-CM | POA: Diagnosis not present

## 2019-12-22 LAB — CBC WITH DIFFERENTIAL/PLATELET
Abs Immature Granulocytes: 0.02 10*3/uL (ref 0.00–0.07)
Basophils Absolute: 0 10*3/uL (ref 0.0–0.1)
Basophils Relative: 0 %
Eosinophils Absolute: 0.1 10*3/uL (ref 0.0–0.5)
Eosinophils Relative: 1 %
HCT: 38.5 % — ABNORMAL LOW (ref 39.0–52.0)
Hemoglobin: 12.5 g/dL — ABNORMAL LOW (ref 13.0–17.0)
Immature Granulocytes: 0 %
Lymphocytes Relative: 20 %
Lymphs Abs: 1.4 10*3/uL (ref 0.7–4.0)
MCH: 32.5 pg (ref 26.0–34.0)
MCHC: 32.5 g/dL (ref 30.0–36.0)
MCV: 100 fL (ref 80.0–100.0)
Monocytes Absolute: 0.9 10*3/uL (ref 0.1–1.0)
Monocytes Relative: 12 %
Neutro Abs: 4.6 10*3/uL (ref 1.7–7.7)
Neutrophils Relative %: 67 %
Platelets: 243 10*3/uL (ref 150–400)
RBC: 3.85 MIL/uL — ABNORMAL LOW (ref 4.22–5.81)
RDW: 12.5 % (ref 11.5–15.5)
WBC: 7 10*3/uL (ref 4.0–10.5)
nRBC: 0 % (ref 0.0–0.2)

## 2019-12-22 LAB — COMPREHENSIVE METABOLIC PANEL
ALT: 21 U/L (ref 0–44)
AST: 18 U/L (ref 15–41)
Albumin: 3 g/dL — ABNORMAL LOW (ref 3.5–5.0)
Alkaline Phosphatase: 59 U/L (ref 38–126)
Anion gap: 8 (ref 5–15)
BUN: 16 mg/dL (ref 8–23)
CO2: 26 mmol/L (ref 22–32)
Calcium: 9.2 mg/dL (ref 8.9–10.3)
Chloride: 101 mmol/L (ref 98–111)
Creatinine, Ser: 0.67 mg/dL (ref 0.61–1.24)
GFR, Estimated: >60 mL/min (ref >60)
Glucose, Bld: 125 mg/dL — ABNORMAL HIGH (ref 70–99)
Potassium: 4.5 mmol/L (ref 3.5–5.1)
Sodium: 135 mmol/L (ref 135–145)
Total Bilirubin: 0.8 mg/dL (ref 0.3–1.2)
Total Protein: 6.2 g/dL — ABNORMAL LOW (ref 6.5–8.1)

## 2019-12-22 MED ORDER — OXYMETAZOLINE HCL 0.05 % NA SOLN
1.0000 | Freq: Once | NASAL | Status: AC
  Administered 2019-12-22: 1 via NASAL
  Filled 2019-12-22: qty 30
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
## 2019-12-22 NOTE — Discharge Instructions (Signed)
As we discussed, you can apply Vaseline to the nostril to help with nosebleed.  We provided you outpatient referral to ENT.  Please follow-up with them.  Return the emergency department for any worsening bleeding, bleeding that will stop or any other worsening concerning symptoms.

## 2019-12-26 ENCOUNTER — Other Ambulatory Visit: Payer: Self-pay | Admitting: Neurosurgery

## 2019-12-26 DIAGNOSIS — S32030A Wedge compression fracture of third lumbar vertebra, initial encounter for closed fracture: Secondary | ICD-10-CM

## 2020-01-12 ENCOUNTER — Other Ambulatory Visit: Payer: Medicare Other

## 2020-01-13 ENCOUNTER — Ambulatory Visit
Admission: RE | Admit: 2020-01-13 | Discharge: 2020-01-13 | Disposition: A | Discharge status: Home / Self Care | Payer: Medicare Other | Source: Ambulatory Visit | Attending: Neurosurgery | Admitting: Neurosurgery

## 2020-01-13 ENCOUNTER — Other Ambulatory Visit: Payer: Self-pay

## 2020-01-13 DIAGNOSIS — S32030A Wedge compression fracture of third lumbar vertebra, initial encounter for closed fracture: Secondary | ICD-10-CM

## 2021-04-03 ENCOUNTER — Telehealth: Payer: Self-pay | Admitting: Internal Medicine

## 2021-04-03 ENCOUNTER — Encounter: Payer: Self-pay | Admitting: Internal Medicine

## 2021-04-03 NOTE — Telephone Encounter (Signed)
Opened in error

## 2021-05-06 ENCOUNTER — Other Ambulatory Visit: Payer: Self-pay

## 2021-05-06 ENCOUNTER — Ambulatory Visit (AMBULATORY_SURGERY_CENTER): Payer: Medicare Other | Admitting: *Deleted

## 2021-05-06 VITALS — Ht 76.0 in | Wt 210.0 lb

## 2021-05-06 DIAGNOSIS — Z86010 Personal history of colonic polyps: Secondary | ICD-10-CM

## 2021-05-06 NOTE — Progress Notes (Signed)

## 2021-05-27 ENCOUNTER — Encounter: Payer: Self-pay | Admitting: Internal Medicine

## 2021-05-27 ENCOUNTER — Ambulatory Visit (AMBULATORY_SURGERY_CENTER): Payer: Medicare Other | Admitting: Internal Medicine

## 2021-05-27 VITALS — BP 120/64 | HR 56 | Temp 98.4°F | Resp 16 | Ht 76.0 in | Wt 210.0 lb

## 2021-05-27 DIAGNOSIS — D124 Benign neoplasm of descending colon: Secondary | ICD-10-CM | POA: Diagnosis not present

## 2021-05-27 DIAGNOSIS — D125 Benign neoplasm of sigmoid colon: Secondary | ICD-10-CM | POA: Diagnosis not present

## 2021-05-27 DIAGNOSIS — Z86010 Personal history of colonic polyps: Secondary | ICD-10-CM

## 2021-05-27 DIAGNOSIS — D127 Benign neoplasm of rectosigmoid junction: Secondary | ICD-10-CM | POA: Diagnosis not present

## 2021-05-27 DIAGNOSIS — D128 Benign neoplasm of rectum: Secondary | ICD-10-CM

## 2021-05-27 MED ORDER — SODIUM CHLORIDE 0.9 % IV SOLN: 500.0000 mL | Freq: Once | INTRAVENOUS | Status: DC

## 2021-05-27 NOTE — Patient Instructions (Addendum)
I found and removed 3 small polyps that look benign. ?I will let you know pathology results and when to have another routine colonoscopy by mail and/or My Chart. ? ?You also have a condition called diverticulosis - common and not usually a problem. Please read the handout provided. ? ?I appreciate the opportunity to care for you. ?Gatha Mayer, MD, [facility] ? ? ? ?YOU HAD AN ENDOSCOPIC PROCEDURE TODAY AT [facility]:   Refer to the procedure report that was given to you for any specific questions about what was found during the examination.  If the procedure report does not answer your questions, please call your gastroenterologist to clarify.  If you requested that your care partner not be given the details of your procedure findings, then the procedure report has been included in a sealed envelope for you to review at your convenience later. ? ?YOU SHOULD EXPECT: Some feelings of bloating in the abdomen. Passage of more gas than usual.  Walking can help get rid of the air that was put into your GI tract during the procedure and reduce the bloating. If you had a lower endoscopy (such as a colonoscopy or flexible sigmoidoscopy) you may notice spotting of blood in your stool or on the toilet paper. If you underwent a bowel prep for your procedure, you may not have a normal bowel movement for a few days. ? ?Please Note:  You might notice some irritation and congestion in your nose or some drainage.  This is from the oxygen used during your procedure.  There is no need for concern and it should clear up in a day or so. ? ?SYMPTOMS TO REPORT IMMEDIATELY: ? ?Following lower endoscopy (colonoscopy or flexible sigmoidoscopy): ? Excessive amounts of blood in the stool ? Significant tenderness or worsening of abdominal pains ? Swelling of the abdomen that is new, acute ? Fever of 100?F or higher ? ? ? ?For urgent or emergent issues, a gastroenterologist can be reached at any hour by calling [phone]. ?Do not use MyChart messaging for urgent concerns.  ? ? ?DIET:  We do recommend a small meal at first, but then you may proceed to your regular diet.  Drink plenty of fluids but you should avoid alcoholic beverages for 24 hours. ? ?ACTIVITY:  You should plan to take it easy for the rest of today and you should NOT DRIVE or use heavy machinery until tomorrow (because of the sedation medicines used during the test).   ? ?FOLLOW UP: ?Our staff will call the number listed on your records 48-72 hours following your procedure to check on you and address any questions or concerns that you may have regarding the information given to you following your procedure. If we do not reach you, we will leave a message.  We will attempt to reach you two times.  During this call, we will ask if you have developed any symptoms of COVID 19. If you develop any symptoms (ie: fever, flu-like symptoms, shortness of breath, cough etc.) before then, please call [phone].  If you test positive for Covid 19 in the 2 weeks post procedure, please call and report this information to [place].   ? ?If any biopsies were taken you will be contacted by phone or by letter within the next 1-3 weeks.  Please call us at [phone] if you have not heard about the biopsies in 3 weeks.  ? ? ?SIGNATURES/CONFIDENTIALITY: ?You and/or your care partner have signed paperwork  which will be entered into your electronic medical record.  These signatures attest to the fact that that the information above on your After Visit Summary has been reviewed and is understood.  Full responsibility of the confidentiality of this discharge information lies with you and/or your care-partner.  ?

## 2021-05-27 NOTE — Progress Notes (Signed)
Called to room to assist during endoscopic procedure.  Patient ID and intended procedure confirmed with present staff. Received instructions for my participation in the procedure from the performing physician.  

## 2021-05-27 NOTE — Progress Notes (Signed)
[facility] History and Physical ? ? ?Primary Care Physician:  Lujean Amel, MD ? ? ?Reason for Procedure:   Hx colon polyps ? ?Plan:    colonoscopy ? ? ? ? ?HPI: Victor Pham is a 71 y.o. male  ?Hx 3 diminutive adenomas 2010 and a diminutive adenoma 2014 ? ?Past Medical History:  ?Diagnosis Date  ? Anxiety   ? Arthritis   ? back  ? Asthma   ? Cataract   ? removed  ? Depression   ? Hyperlipidemia   ? Hypertension   ? Personal history of adenomatous colonic polyps 12/28/2011  ? 01/2009 = 3 diminutive adenomas   ? Pre-diabetes   ? Substance abuse ([place])   ? etoh,but have stopped updated 05/06/21  ? Urticaria   ? ? ?Past Surgical History:  ?Procedure Laterality Date  ? CATARACT EXTRACTION W/ INTRAOCULAR LENS  IMPLANT, BILATERAL Bilateral 02/04/2008  ? Dr. Waynetta Sandy  ? COLONOSCOPY    ? EYE SURGERY    ? POLYPECTOMY    ? PROSTATE SURGERY  02/03/2005  ? Biopsy/Dr. Grapey,did not find anything updated 05/06/21  ? ? ?Prior to Admission medications   ?Medication Sig Start Date End Date Taking? Authorizing Provider  ?bisoprolol-hydrochlorothiazide (ZIAC) 5-6.25 MG tablet    Yes [provider]  ?clonazePAM (KLONOPIN) 2 MG tablet TAKE 1 TABLET BY MOUTH 3 TIMES DAILY ?Patient taking differently: as needed. 07/13/14  Yes Vicie Mutters R, PA-C  ?CRESTOR 40 MG tablet TAKE 1 TABLET BY MOUTH DAILY 07/01/14  Yes Unk Pinto, MD  ?FLUoxetine (PROZAC) 20 MG capsule Take 20 mg by mouth daily. 04/17/21  Yes [provider]  ?montelukast (SINGULAIR) 10 MG tablet Take by mouth. 07/28/19  Yes [provider]  ?tamsulosin (FLOMAX) 0.4 MG CAPS capsule Take 0.4 mg by mouth daily. 05/06/21  Yes [provider]  ?albuterol (PROVENTIL HFA;VENTOLIN HFA) 108 (90 BASE) MCG/ACT inhaler Inhale 1-2 puffs into the lungs every 6 (six) hours as needed for wheezing or shortness of breath. 04/12/14   Vladimir Crofts, PA-C  ?aspirin 81 MG tablet Take 81 mg by mouth daily. ?Patient not taking: Reported on 05/06/2021     [provider]  ?bisoprolol-hydrochlorothiazide [facility]) 5-6.25 MG per tablet Take 1 tablet every morning for BP 06/23/14 10/15/16  Unk Pinto, MD  ?citalopram (CELEXA) 40 MG tablet TAKE 1 TABLET BY MOUTH EVERY DAY INS ALLOWS 30 DAY ?Patient not taking: Reported on 05/27/2021 07/16/14   Unk Pinto, MD  ?FLUoxetine (PROZAC) 10 MG capsule Take 10 mg by mouth daily. 09/08/16 09/08/17  [provider]  ?Oxycodone HCl 10 MG TABS as needed.    [provider]  ?sildenafil (REVATIO) 20 MG tablet Take 1 to 5 pills qd as needed for XXXX ?Patient not taking: Reported on 05/06/2021 06/23/14   Unk Pinto, MD  ? ? ?Current Outpatient Medications  ?Medication Sig Dispense Refill  ? bisoprolol-hydrochlorothiazide (ZIAC) 5-6.25 MG tablet     ? clonazePAM (KLONOPIN) 2 MG tablet TAKE 1 TABLET BY MOUTH 3 TIMES DAILY (Patient taking differently: as needed.) 90 tablet 0  ? CRESTOR 40 MG tablet TAKE 1 TABLET BY MOUTH DAILY 30 tablet 2  ? FLUoxetine (PROZAC) 20 MG capsule Take 20 mg by mouth daily.    ? montelukast (SINGULAIR) 10 MG tablet Take by mouth.    ? tamsulosin (FLOMAX) 0.4 MG CAPS capsule Take 0.4 mg by mouth daily.    ? albuterol (PROVENTIL HFA;VENTOLIN HFA) 108 (90 BASE) MCG/ACT inhaler Inhale 1-2 puffs  into the lungs every 6 (six) hours as needed for wheezing or shortness of breath. 18 g 2  ? aspirin 81 MG tablet Take 81 mg by mouth daily. (Patient not taking: Reported on 05/06/2021)    ? bisoprolol-hydrochlorothiazide (ZIAC) 5-6.25 MG per tablet Take 1 tablet every morning for BP 90 tablet 1  ? citalopram (CELEXA) 40 MG tablet TAKE 1 TABLET BY MOUTH EVERY DAY INS ALLOWS 30 DAY (Patient not taking: Reported on 05/27/2021) 90 tablet 0  ? FLUoxetine (PROZAC) 10 MG capsule Take 10 mg by mouth daily.    ? Oxycodone HCl 10 MG TABS as needed.    ? sildenafil (REVATIO) 20 MG tablet Take 1 to 5 pills qd as needed for XXXX (Patient not taking: Reported on 05/06/2021) 30 tablet 99  ? ?Current  Facility-Administered Medications  ?Medication Dose Route Frequency Provider Last Rate Last Admin  ? 0.9 %  sodium chloride infusion  500 mL Intravenous Once Gatha Mayer, MD      ? ? ?Allergies as of 05/27/2021 - Review Complete 05/27/2021  ?Allergen Reaction Noted  ? Lipitor [atorvastatin]  03/01/2013  ? Penicillins  01/08/2009  ? ? ?Family History  ?Problem Relation Age of Onset  ? Asthma Mother   ? Hypertension Father   ? Colon cancer Neg Hx   ? Pancreatic cancer Neg Hx   ? Stomach cancer Neg Hx   ? Esophageal cancer Neg Hx   ? Rectal cancer Neg Hx   ? Prostate cancer Neg Hx   ? Colon polyps Neg Hx   ? Crohn's disease Neg Hx   ? ? ?Social History  ? ?Socioeconomic History  ? Marital status: Married  ?  Spouse name: Not on file  ? Number of children: Not on file  ? Years of education: Not on file  ? Highest education level: Not on file  ?Occupational History  ? Not on file  ?Tobacco Use  ? Smoking status: Every Day  ?  Packs/day: 1.50  ?  Types: Cigarettes  ?  Passive exposure: Current  ? Smokeless tobacco: Never  ?Vaping Use  ? Vaping Use: Never used  ?Substance and Sexual Activity  ? Alcohol use: Not Currently  ?  Alcohol/week: 2.0 standard drinks  ?  Types: 2 Cans of beer per week  ? Drug use: No  ? Sexual activity: Not on file  ?Other Topics Concern  ? Not on file  ?Social History Narrative  ? Not on file  ? ?Social Determinants of Health  ? ?Financial Resource Strain: Not on file  ?Food Insecurity: Not on file  ?Transportation Needs: Not on file  ?Physical Activity: Not on file  ?Stress: Not on file  ?Social Connections: Not on file  ?Intimate Partner Violence: Not on file  ? ? ?Review of Systems: ? ?All other review of systems negative except as mentioned in the HPI. ? ?Physical Exam: ?Vital signs ?BP (!) 86/50   Pulse 61   Temp 98.4 ?F (36.9 ?C)   Ht '6\' 4"'$  (1.93 m)   Wt 210 lb (95.3 kg)   SpO2 96%   BMI 25.56 kg/m?  ? ?General:   Alert,  Well-developed, well-nourished, pleasant and cooperative in  NAD ?Lungs:  Clear throughout to auscultation.   ?Heart:  Regular rate and rhythm; no murmurs, clicks, rubs,  or gallops. ?Abdomen:  Soft, nontender and nondistended. Normal bowel sounds.   ?Neuro/Psych:  Alert and cooperative. Normal mood and affect. A and O x 3 ? ? ?@  Gatha Mayer, MD, Marval Regal ?[facility] ?[phone] (pager) ?05/27/2021 1:41 PM@ ? ?

## 2021-05-27 NOTE — Op Note (Signed)
[facility] ?Patient Name: Victor Pham ?Procedure Date: 05/27/2021 12:44 PM ?MRN: [id number] ?Endoscopist: Gatha Mayer , MD ?Age: 71 ?Referring MD:  ?Date of Birth: 10-Jan-1951 ?Gender: Male ?Account #: [id number] ?Procedure:                Colonoscopy ?Indications:              Surveillance: Personal history of adenomatous  ?                          polyps on last colonoscopy > 5 years ago, Last  ?                          colonoscopy: 2014 ?Medicines:                Monitored Anesthesia Care ?Procedure:                Pre-Anesthesia Assessment: ?                          - Prior to the procedure, a History and Physical  ?                          was performed, and patient medications and  ?                          allergies were reviewed. The patient's tolerance of  ?                          previous anesthesia was also reviewed. The risks  ?                          and benefits of the procedure and the sedation  ?                          options and risks were discussed with the patient.  ?                          All questions were answered, and informed consent  ?                          was obtained. Prior Anticoagulants: The patient has  ?                          taken no previous anticoagulant or antiplatelet  ?                          agents. ASA Grade Assessment: II - A patient with  ?                          mild systemic disease. After reviewing the risks  ?                          and benefits, the patient was deemed in  ?  satisfactory condition to undergo the procedure. ?                          After obtaining informed consent, the colonoscope  ?                          was passed under direct vision. Throughout the  ?                          procedure, the patient's blood pressure, pulse, and  ?                          oxygen saturations were monitored continuously. The  ?                          Olympus CF-[id number] (#[id number]) Colonoscope was  ?                           introduced through the anus and advanced to the the  ?                          cecum, identified by appendiceal orifice and  ?                          ileocecal valve. The colonoscopy was performed  ?                          without difficulty. The patient tolerated the  ?                          procedure well. The quality of the bowel  ?                          preparation was good. The bowel preparation used  ?                          was Miralax via split dose instruction. The  ?                          ileocecal valve, appendiceal orifice, and rectum  ?                          were photographed. ?Scope In: 1:43:48 PM ?Scope Out: 2:02:13 PM ?Scope Withdrawal Time: 0 hours 13 minutes 36 seconds  ?Total Procedure Duration: 0 hours 18 minutes 25 seconds  ?Findings:                 The perianal and digital rectal examinations were  ?                          normal. Pertinent negatives include normal prostate  ?                          (size, shape, and consistency). ?  Three sessile polyps were found in the rectum,  ?                          sigmoid colon and descending colon. The polyps were  ?                          diminutive in size. These polyps were removed with  ?                          a cold snare. Resection and retrieval were  ?                          complete. Verification of patient identification  ?                          for the specimen was done. Estimated blood loss was  ?                          minimal. ?                          Multiple diverticula were found in the sigmoid  ?                          colon. ?                          The exam was otherwise without abnormality on  ?                          direct and retroflexion views. ?Complications:            No immediate complications. ?Estimated Blood Loss:     Estimated blood loss was minimal. ?Impression:               - Three diminutive polyps in the rectum, in the  ?                           sigmoid colon and in the descending colon, removed  ?                          with a cold snare. Resected and retrieved. ?                          - Diverticulosis in the sigmoid colon. ?                          - The examination was otherwise normal on direct  ?                          and retroflexion views. ?                          - Personal history of colonic polyps. 2010 - 3  ?  diminutive adenomas and 2014 1 diminutive adenoma ?Recommendation:           - Patient has a contact number available for  ?                          emergencies. The signs and symptoms of potential  ?                          delayed complications were discussed with the  ?                          patient. Return to normal activities tomorrow.  ?                          Written discharge instructions were provided to the  ?                          patient. ?                          - Resume previous diet. ?                          - Continue present medications. ?                          - Repeat colonoscopy is recommended for  ?                          surveillance. The colonoscopy date will be  ?                          determined after pathology results from today's  ?                          exam become available for review. ?Gatha Mayer, MD ?05/27/2021 2:11:48 PM ?This report has been signed electronically. ?

## 2021-05-27 NOTE — Progress Notes (Signed)
PT taken to PACU. Monitors in place. VSS. Report given to RN. 

## 2021-05-27 NOTE — Progress Notes (Signed)
Pt's states no medical or surgical changes since previsit or office visit. 

## 2021-05-30 ENCOUNTER — Encounter: Payer: Self-pay | Admitting: Internal Medicine

## 2021-10-17 ENCOUNTER — Other Ambulatory Visit: Payer: Self-pay | Admitting: Family Medicine

## 2021-10-17 DIAGNOSIS — Z136 Encounter for screening for cardiovascular disorders: Secondary | ICD-10-CM

## 2021-10-22 ENCOUNTER — Ambulatory Visit
Admission: RE | Admit: 2021-10-22 | Discharge: 2021-10-22 | Disposition: A | Discharge status: Home / Self Care | Payer: Medicare Other | Source: Ambulatory Visit | Attending: Family Medicine | Admitting: Family Medicine

## 2021-10-22 DIAGNOSIS — Z136 Encounter for screening for cardiovascular disorders: Secondary | ICD-10-CM

## 2021-10-23 IMAGING — CT CT L SPINE W/O CM
1 of 7 series · 6 of 14 positions shown, 8 images · non-contrast
Comparison: Lumbar spine radiographs 12/16/2019

CLINICAL DATA: L3 compression fracture.

EXAM:
CT LUMBAR SPINE WITHOUT CONTRAST
TECHNIQUE: Multidetector CT imaging of the lumbar spine was performed without
intravenous contrast administration. Multiplanar CT image
reconstructions were also generated.

[Series 3: l spine soft · axial · 0.37mm/px · z∈[-280,-85]mm · 6 of 93 slices shown, 8 images]
[im 14/93  soft-tissue]
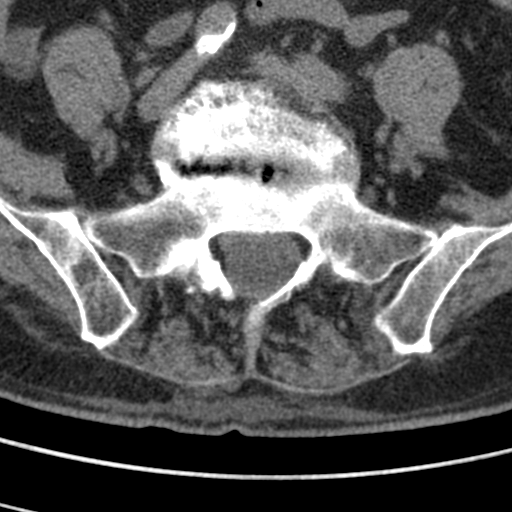
[im 14/93  bone]
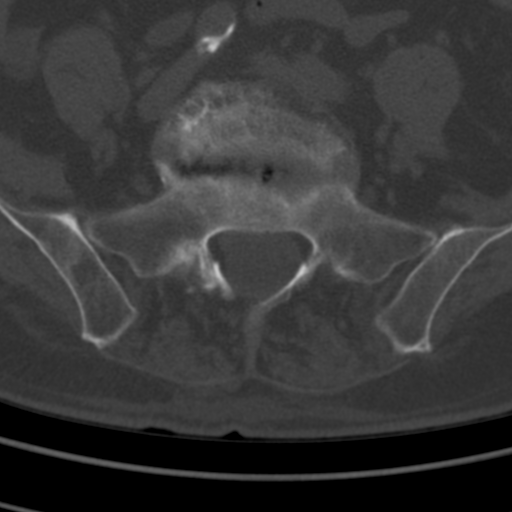
[im 27/93  bone]
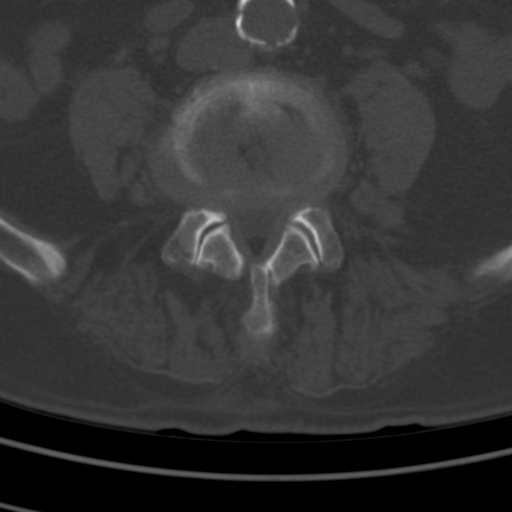
[im 40/93  bone]
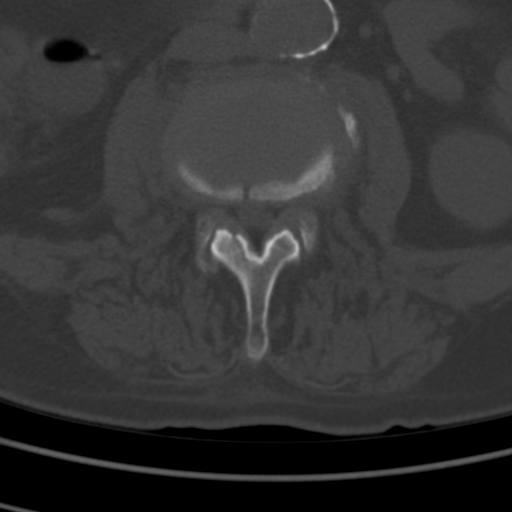
[im 53/93  bone]
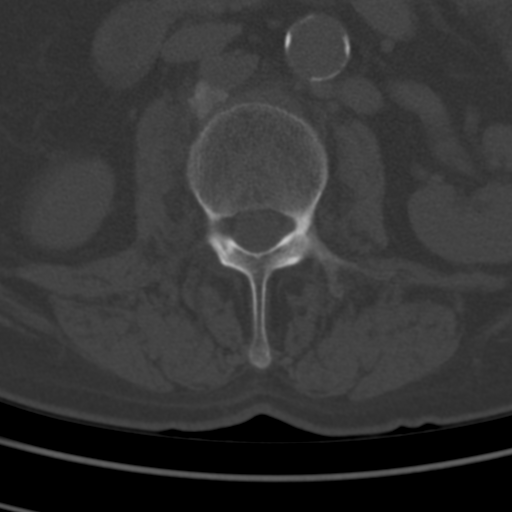
[im 66/93  soft-tissue]
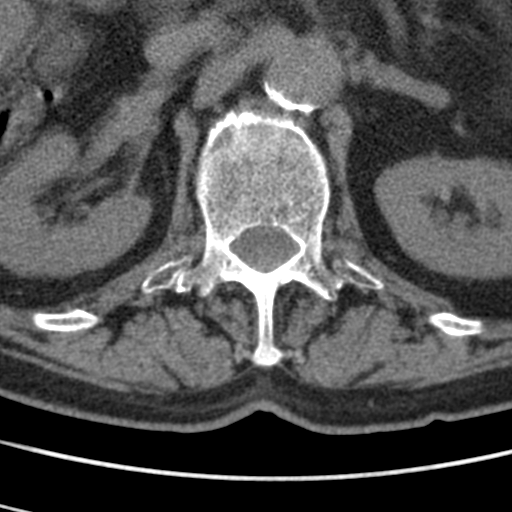
[im 66/93  bone]
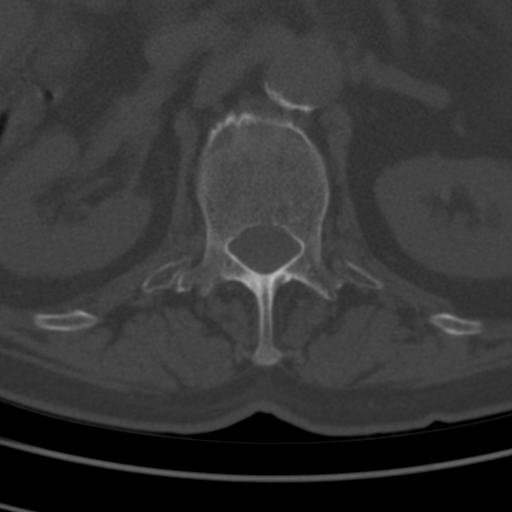
[im 79/93  bone]
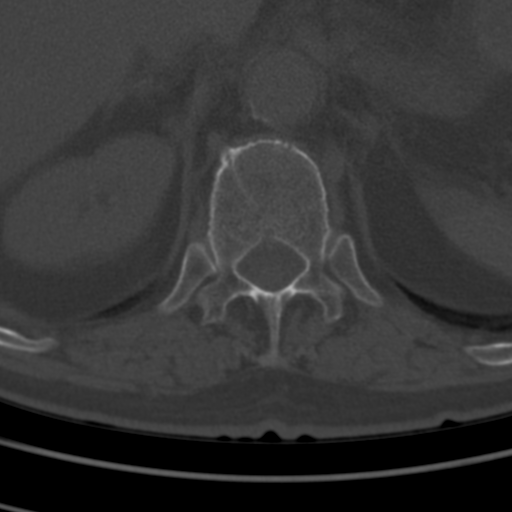

[6 of 14 positions shown; findings below may reference images not displayed]

FINDINGS: Segmentation: 5 non rib-bearing lumbar type vertebral bodies are
present. The lowest fully formed vertebral body is L5.

Alignment: No significant listhesis is present. Straightening of the
normal lumbar lordosis is noted.

Vertebrae: A progressive L3 compression fracture is present. There
is now retropulsion of bone extending 4 mm into the spinal canal.
Vertebral body is diminished to 6 mm in height centrally. There is
some anterior displacement as well.

A remote inferior endplate fractures present at L1. Chronic endplate
degenerative changes are present at L4-5 with an inferior endplate
Schmorl's node at L4 anteriorly on the right. Chronic sclerotic
changes are present at L5-S1, right greater than left.

Paraspinal and other soft tissues: Paraspinous soft tissues
demonstrate no acute abnormality. Atherosclerotic calcifications are
present in the aorta and branch vessels without aneurysm. No
significant adenopathy is present.

Disc levels: L1-2: Mild facet hypertrophy present without
significant stenosis.

L2-3: A broad-based disc protrusion is present. Moderate facet
hypertrophy ligamentum flavum thickening is noted. This results in
moderate central canal and bilateral foraminal stenosis at this
level. Retropulsed bone results in severe central canal stenosis at
the L3 level.

L3-4: Broad-based disc protrusion moderate facet hypertrophy is
noted bilaterally. Moderate central canal stenosis is present with
left greater than right subarticular narrowing. Moderate to severe
foraminal narrowing is worse left than right. Retropulsed bone
contributes.

L4-5: A broad-based disc protrusion is present. Moderate facet
hypertrophy and ligamentum flavum thickening is noted. Moderate
subarticular and foraminal narrowing is present bilaterally.

L5-S1: Broad-based disc protrusion is present. Asymmetric
right-sided facet hypertrophy is noted. Disc extends further into
the right foramen. Moderate right and mild left foraminal narrowing
is present. Central canal is patent.
IMPRESSION: 1. Progressive L3 compression fracture diminished to 6 mm in height
with 4 mm of retropulsion of bone resulting in severe central canal
stenosis at the L3 level.
2. Retropulsed bone contributes to moderate central canal stenosis
at L2-3 and L3-4 disc levels.
3. Moderate to severe foraminal narrowing bilaterally at L3-4 is
worse left than right.
4. Moderate subarticular and foraminal narrowing bilaterally at
L4-5.
5. Moderate right and mild left foraminal narrowing at L5-S1.
6. Aortic Atherosclerosis ([id number]).

## 2021-10-25 ENCOUNTER — Other Ambulatory Visit: Payer: Self-pay | Admitting: Family Medicine

## 2021-10-25 DIAGNOSIS — R29890 Loss of height: Secondary | ICD-10-CM

## 2021-11-04 ENCOUNTER — Other Ambulatory Visit: Payer: Self-pay | Admitting: Urology

## 2021-11-04 DIAGNOSIS — R972 Elevated prostate specific antigen [PSA]: Secondary | ICD-10-CM

## 2021-11-04 DIAGNOSIS — Z125 Encounter for screening for malignant neoplasm of prostate: Secondary | ICD-10-CM

## 2021-11-06 ENCOUNTER — Other Ambulatory Visit: Payer: Self-pay | Admitting: Family Medicine

## 2021-11-06 DIAGNOSIS — M810 Age-related osteoporosis without current pathological fracture: Secondary | ICD-10-CM

## 2021-11-07 ENCOUNTER — Other Ambulatory Visit: Payer: Self-pay | Admitting: Student

## 2021-11-07 DIAGNOSIS — M810 Age-related osteoporosis without current pathological fracture: Secondary | ICD-10-CM

## 2021-11-11 ENCOUNTER — Ambulatory Visit
Admission: RE | Admit: 2021-11-11 | Discharge: 2021-11-11 | Disposition: A | Discharge status: Home / Self Care | Payer: Medicare Other | Source: Ambulatory Visit | Attending: Student | Admitting: Student

## 2021-11-11 DIAGNOSIS — M810 Age-related osteoporosis without current pathological fracture: Secondary | ICD-10-CM

## 2021-11-28 ENCOUNTER — Ambulatory Visit
Admission: RE | Admit: 2021-11-28 | Discharge: 2021-11-28 | Disposition: A | Discharge status: Home / Self Care | Payer: Medicare Other | Source: Ambulatory Visit | Attending: Urology | Admitting: Urology

## 2021-11-28 DIAGNOSIS — Z125 Encounter for screening for malignant neoplasm of prostate: Secondary | ICD-10-CM

## 2021-11-28 DIAGNOSIS — R972 Elevated prostate specific antigen [PSA]: Secondary | ICD-10-CM

## 2021-11-28 MED ORDER — GADOPICLENOL 0.5 MMOL/ML IV SOLN
9.0000 mL | Freq: Once | INTRAVENOUS | Status: AC | PRN
  Administered 2021-11-28: 9 mL via INTRAVENOUS

## 2021-12-02 ENCOUNTER — Other Ambulatory Visit: Payer: Self-pay | Admitting: *Deleted

## 2021-12-02 DIAGNOSIS — Z87891 Personal history of nicotine dependence: Secondary | ICD-10-CM

## 2021-12-02 DIAGNOSIS — F17210 Nicotine dependence, cigarettes, uncomplicated: Secondary | ICD-10-CM

## 2021-12-02 DIAGNOSIS — Z122 Encounter for screening for malignant neoplasm of respiratory organs: Secondary | ICD-10-CM

## 2022-01-08 ENCOUNTER — Encounter: Payer: Medicare Other | Admitting: Acute Care

## 2022-01-09 ENCOUNTER — Other Ambulatory Visit: Payer: Medicare Other

## 2022-03-28 ENCOUNTER — Other Ambulatory Visit: Payer: Self-pay | Admitting: Family Medicine

## 2022-03-28 ENCOUNTER — Ambulatory Visit
Admission: RE | Admit: 2022-03-28 | Discharge: 2022-03-28 | Disposition: A | Discharge status: Home / Self Care | Payer: Medicare Other | Source: Ambulatory Visit | Attending: Family Medicine | Admitting: Family Medicine

## 2022-03-28 DIAGNOSIS — R053 Chronic cough: Secondary | ICD-10-CM

## 2022-05-07 ENCOUNTER — Encounter: Payer: Self-pay | Admitting: Physician Assistant

## 2022-05-07 ENCOUNTER — Ambulatory Visit (INDEPENDENT_AMBULATORY_CARE_PROVIDER_SITE_OTHER): Payer: Medicare Other | Admitting: Physician Assistant

## 2022-05-07 DIAGNOSIS — Z87891 Personal history of nicotine dependence: Secondary | ICD-10-CM | POA: Diagnosis not present

## 2022-05-07 NOTE — Patient Instructions (Signed)
Thank you for participating in the [facility]. It was our pleasure to meet you today. We will call you with the results of your scan within the next few days. Your scan will be assigned a Lung RADS category score by the physicians reading the scans.  This Lung RADS score determines follow up scanning.  See below for description of categories, and follow up screening recommendations. We will be in touch to schedule your follow up screening annually or based on recommendations of our providers. We will fax a copy of your scan results to your Primary Care Physician, or the physician who referred you to the program, to ensure they have the results. Please call the office if you have any questions or concerns regarding your scanning experience or results.  Our office number is [phone]. Please speak with Denise Phelps, RN. , or  Denise Buckner RN, They are  our Lung Cancer Screening RN.'s If They are unavailable when you call, Please leave a message on the voice mail. We will return your call at our earliest convenience.This voice mail is monitored several times a day.  Remember, if your scan is normal, we will scan you annually as long as you continue to meet the criteria for the program. (Age 50-80, Current smoker or smoker who has quit within the last 15 years). If you are a smoker, remember, quitting is the single most powerful action that you can take to decrease your risk of lung cancer and other pulmonary, breathing related problems. We know quitting is hard, and we are here to help.  Please let us know if there is anything we can do to help you meet your goal of quitting. If you are a former smoker, congratulations. We are proud of you! Remain smoke free! Remember you can refer friends or family members through the number above.  We will screen them to make sure they meet criteria for the program. Thank you for helping us take better care of you by  participating in Lung Screening.  You can receive free nicotine replacement therapy ( patches, gum or mints) by calling 1-800-QUIT NOW. Please call so we can get you on the path to becoming  a non-smoker. I know it is hard, but you can do this!  Lung RADS Categories:  Lung RADS 1: no nodules or definitely non-concerning nodules.  Recommendation is for a repeat annual scan in 12 months.  Lung RADS 2:  nodules that are non-concerning in appearance and behavior with a very low likelihood of becoming an active cancer. Recommendation is for a repeat annual scan in 12 months.  Lung RADS 3: nodules that are probably non-concerning , includes nodules with a low likelihood of becoming an active cancer.  Recommendation is for a 6-month repeat screening scan. Often noted after an upper respiratory illness. We will be in touch to make sure you have no questions, and to schedule your 6-month scan.  Lung RADS 4 A: nodules with concerning findings, recommendation is most often for a follow up scan in 3 months or additional testing based on our provider's assessment of the scan. We will be in touch to make sure you have no questions and to schedule the recommended 3 month follow up scan.  Lung RADS 4 B:  indicates findings that are concerning. We will be in touch with you to schedule additional diagnostic testing based on our provider's  assessment of the scan.  Other options for assistance in smoking cessation (   As covered by your insurance benefits)  Hypnosis for smoking cessation  [employer] [phone]  Acupuncture for smoking cessation  [facility] [phone]   

## 2022-05-07 NOTE — Progress Notes (Signed)
Virtual Visit via Telephone Note  I connected with Victor Pham on 05/07/22 at 11:00 AM EDT by telephone and verified that I am speaking with the correct person using two identifiers.  Location: Patient: home Provider: working virtually from home   I discussed the limitations, risks, security and privacy concerns of performing an evaluation and management service by telephone and the availability of in person appointments. I also discussed with the patient that there may be a patient responsible charge related to this service. The patient expressed understanding and agreed to proceed.       Shared Decision Making Visit Lung Cancer Screening Program [phone])   Eligibility: Age 72 y.o. Pack Years Smoking History Calculation 106 (# packs/per year x # years smoked) Recent History of coughing up blood  no Unexplained weight loss? no ( >Than 15 pounds within the last 6 months ) Prior History Lung / other cancer no (Diagnosis within the last 5 years already requiring surveillance chest CT Scans). Smoking Status Former Smoker Former Smokers: Years since quit: < 1 year  Quit Date: 2023  Visit Components: Discussion included one or more decision making aids. yes Discussion included risk/benefits of screening. yes Discussion included potential follow up diagnostic testing for abnormal scans. yes Discussion included meaning and risk of over diagnosis. yes Discussion included meaning and risk of False Positives. yes Discussion included meaning of total radiation exposure. yes  Counseling Included: Importance of adherence to annual lung cancer LDCT screening. yes Impact of comorbidities on ability to participate in the program. yes Ability and willingness to under diagnostic treatment. yes  Smoking Cessation Counseling: Former Smokers:  Discussed the importance of maintaining cigarette abstinence. yes Diagnosis Code: Personal History of Nicotine Dependence. [id number] Information about  tobacco cessation classes and interventions provided to patient. Yes Patient provided with "ticket" for LDCT Scan. N/a Written Order for Lung Cancer Screening with LDCT placed in Epic. Yes (CT Chest Lung Cancer Screening Low Dose W/O CM) [id number] Z12.2-Screening of respiratory organs Z87.891-Personal history of nicotine dependence  I spent 25 minutes of face to face time/virtual visit time  with the patient discussing the risks and benefits of lung cancer screening. We took the time to pause the power point at intervals to allow for questions to be asked and answered to ensure understanding. We discussed that he had taken the single most powerful action possible to decrease his risk of developing lung cancer when he quit smoking. I counseled him to remain smoke free, and to contact me if he ever had the desire to smoke again so that I can provide resources and tools to help support the effort to remain smoke free. We discussed the time and location of the scan, and that either  Doroteo Glassman RN, Joella Prince, RN or I  or I will call / send a letter with the results within  24-72 hours of receiving them. He has the office contact information in the event he needs to speak with me,  He verbalized understanding of all of the above and had no further questions upon leaving the office.     I explained to the patient that there has been a high incidence of coronary artery disease noted on these exams. I explained that this is a non-gated exam therefore degree or severity cannot be determined. This patient is on statin therapy. I have asked the patient to follow-up with their PCP regarding any incidental finding of coronary artery disease and management with diet or medication as they  feel is clinically indicated. The patient verbalized understanding of the above and had no further questions.  Otilio Carpen Yanel Dombrosky, PA-C         Otilio Carpen Thedora Rings, PA-C

## 2022-05-08 ENCOUNTER — Ambulatory Visit
Admission: RE | Admit: 2022-05-08 | Discharge: 2022-05-08 | Disposition: A | Discharge status: Home / Self Care | Payer: Medicare Other | Source: Ambulatory Visit | Attending: Acute Care | Admitting: Acute Care

## 2022-05-08 DIAGNOSIS — Z87891 Personal history of nicotine dependence: Secondary | ICD-10-CM

## 2022-05-08 DIAGNOSIS — Z122 Encounter for screening for malignant neoplasm of respiratory organs: Secondary | ICD-10-CM

## 2022-05-08 DIAGNOSIS — F17210 Nicotine dependence, cigarettes, uncomplicated: Secondary | ICD-10-CM

## 2022-05-09 ENCOUNTER — Other Ambulatory Visit: Payer: Self-pay

## 2022-05-09 ENCOUNTER — Telehealth: Payer: Self-pay | Admitting: *Deleted

## 2022-05-09 ENCOUNTER — Telehealth: Payer: Self-pay | Admitting: Acute Care

## 2022-05-09 DIAGNOSIS — Z87891 Personal history of nicotine dependence: Secondary | ICD-10-CM

## 2022-05-09 DIAGNOSIS — Z122 Encounter for screening for malignant neoplasm of respiratory organs: Secondary | ICD-10-CM

## 2022-05-09 NOTE — Telephone Encounter (Signed)
PT wife calling. Would like recent results of CT please. Her # [phone]

## 2022-05-09 NOTE — Telephone Encounter (Signed)
Returned call to patient and wife to review results of LDCT, after reading results letter in [place].  Wife Olegario Messier asked most of the questions and gave details of patient's health history.  Concerns are the patient has lung nodules and patient's sister has lung cancer.  Discussed the lung nodules mentioned in scan results, as being RADS2 meaning they are not noted as suspicious for lung cancer.  They explained the patient's sister also had several repeat lung scans due to thinking her lung nodules were not suspicious but after several scans, she was diagnosed with lung cancer. Main complaint patient has is coughing during eating and when lying down at night.  Patient is not on reflux medication and wife says she knows it is not reflux since she has reflux, stating patient symptoms are very different.  The patient has discussed these concerns with PCP but 'didn't feel it was taken seriously.'  Possible options would be to discuss symptoms again with PCP and inquire about a swallowing study or could have eval by GI provider.  Wife inquired if there would be additional follow up due to the patient having lung nodules.  Discussed that we would not order follow up CT sooner than 12 months, based upon the current findings.  Wife states she may get a second opinion and agree if she has concerns about the findings we would encourage her to get a second opinion.  Also, patient was encouraged to follow up with PCP again about concern of coughing 'violently' when eating and when lying down at night.  Kandice Robinsons, NP reviewed the results and I also discussed the patient's concerns with her after the call.  Plan for annual LDCT.

## 2022-05-09 NOTE — Telephone Encounter (Signed)
Spoke with patients wife. She states they received the results of patients CT on my chart and they have few questions and would like a call back to go over those results. Maralyn Sago can you please advise

## 2022-05-13 ENCOUNTER — Other Ambulatory Visit (HOSPITAL_COMMUNITY): Payer: Self-pay

## 2022-05-13 DIAGNOSIS — R1310 Dysphagia, unspecified: Secondary | ICD-10-CM

## 2022-05-13 DIAGNOSIS — R059 Cough, unspecified: Secondary | ICD-10-CM

## 2022-05-21 ENCOUNTER — Ambulatory Visit (HOSPITAL_COMMUNITY)
Admission: RE | Admit: 2022-05-21 | Discharge: 2022-05-21 | Disposition: A | Discharge status: Home / Self Care | Payer: Medicare Other | Source: Ambulatory Visit | Attending: Family Medicine | Admitting: Family Medicine

## 2022-05-21 DIAGNOSIS — R1310 Dysphagia, unspecified: Secondary | ICD-10-CM

## 2022-05-21 DIAGNOSIS — R509 Fever, unspecified: Secondary | ICD-10-CM | POA: Diagnosis not present

## 2022-05-21 DIAGNOSIS — R059 Cough, unspecified: Secondary | ICD-10-CM | POA: Diagnosis present

## 2022-05-22 NOTE — Telephone Encounter (Signed)
See separate encounter from 4/5.

## 2022-05-30 ENCOUNTER — Ambulatory Visit: Payer: Medicare Other | Admitting: Cardiology

## 2022-05-30 ENCOUNTER — Encounter: Payer: Self-pay | Admitting: Cardiology

## 2022-05-30 VITALS — BP 139/93 | HR 68 | Resp 16 | Ht 76.0 in | Wt 245.0 lb

## 2022-05-30 DIAGNOSIS — I10 Essential (primary) hypertension: Secondary | ICD-10-CM

## 2022-05-30 DIAGNOSIS — I714 Abdominal aortic aneurysm, without rupture, unspecified: Secondary | ICD-10-CM

## 2022-05-30 DIAGNOSIS — R0609 Other forms of dyspnea: Secondary | ICD-10-CM

## 2022-05-30 DIAGNOSIS — E782 Mixed hyperlipidemia: Secondary | ICD-10-CM

## 2022-05-30 DIAGNOSIS — I700 Atherosclerosis of aorta: Secondary | ICD-10-CM

## 2022-05-30 DIAGNOSIS — Z87891 Personal history of nicotine dependence: Secondary | ICD-10-CM

## 2022-05-30 DIAGNOSIS — R0683 Snoring: Secondary | ICD-10-CM

## 2022-05-30 DIAGNOSIS — I2510 Atherosclerotic heart disease of native coronary artery without angina pectoris: Secondary | ICD-10-CM

## 2022-05-30 NOTE — Progress Notes (Signed)
ID:  KALAI BACA, DOB [birth year], MRN [id number]  PCP:  Darrow Bussing, MD  Cardiologist:  Tessa Lerner, DO, [facility] (established care May 30, 2022)   REASON FOR CONSULT: Coronary artery calcification and Aortic atherosclerosis  REQUESTING PHYSICIAN:  Darrow Bussing, MD [address] [place],  [place] [address]  Chief Complaint  Patient presents with   Shortness of Breath   abnormal CT of the chest   New Patient (Initial Visit)    HPI  Victor Pham is a 72 y.o. Caucasian male who presents to the clinic for evaluation of coronary calcification and aortic atherosclerosis at the request of Koirala, Dibas, MD. His past medical history and cardiovascular risk factors include: former smoker ( quit 11/2021, atleast 1.5 ppd for 55 years), hx of alcohol abuse (quit 2021), HTN, HLD, Prediabetic, AAA (3.4cm as of 10/22/2021), anxiety / depression.   Patient was referred to the practice for evaluation of coronary calcification and aortic atherosclerosis.  He is accompanied by his wife at today's office visit who provides majority of HPI.  Patient has had a history of a smoker's cough for a long duration which led to shortness of breath and increase need for breathing treatments.  He underwent CT scan of the chest for lung cancer screening and was noted to have CAC as well as aortic atherosclerosis.  He was referred to cardiology for further evaluation and management.  Patient denies any active chest pain or heart failure symptoms.  His wife complains of him being tired, fatigued, having trouble sleeping at night, apnea in the middle night making him wake up, and a nonproductive cough.  He has an appointment with pulmonary medicine on 07/01/2022.  FUNCTIONAL STATUS: No structured exercise program or daily routine.   ALLERGIES: Allergies  Allergen Reactions   Lipitor [Atorvastatin]    Penicillins     REACTION: hives    MEDICATION LIST PRIOR TO VISIT: Current Meds   Medication Sig   albuterol (PROVENTIL HFA;VENTOLIN HFA) 108 (90 BASE) MCG/ACT inhaler Inhale 1-2 puffs into the lungs every 6 (six) hours as needed for wheezing or shortness of breath.   aspirin 81 MG tablet Take 81 mg by mouth daily.   benzonatate (TESSALON) 200 MG capsule Take 200 mg by mouth 2 (two) times daily as needed.   bisoprolol-hydrochlorothiazide (ZIAC) 5-6.25 MG per tablet Take 1 tablet every morning for BP   clonazePAM (KLONOPIN) 2 MG tablet TAKE 1 TABLET BY MOUTH 3 TIMES DAILY (Patient taking differently: Take 1 mg by mouth as needed.)   CRESTOR 40 MG tablet TAKE 1 TABLET BY MOUTH DAILY   FLUoxetine (PROZAC) 20 MG capsule Take 20 mg by mouth daily.   fluticasone (FLONASE) 50 MCG/ACT nasal spray Place 2 sprays into both nostrils daily.   ipratropium-albuterol (DUONEB) 0.5-2.5 (3) MG/3ML SOLN Inhale 3 mLs into the lungs every 2 (two) hours as needed.   omeprazole (PRILOSEC) 40 MG capsule Take 40 mg by mouth daily.   tamsulosin (FLOMAX) 0.4 MG CAPS capsule Take 0.4 mg by mouth daily.   triamcinolone cream (KENALOG) 0.1 % Apply topically 2 (two) times daily.     PAST MEDICAL HISTORY: Past Medical History:  Diagnosis Date   AAA (abdominal aortic aneurysm) (HCC)    Anxiety    Arthritis    back   Asthma    Atherosclerosis of aorta (HCC)    Cataract    removed   Coronary atherosclerosis due to calcified coronary lesion    Depression  Hyperlipidemia    Hypertension    Personal history of adenomatous colonic polyps 12/28/2011   01/2009 = 3 diminutive adenomas    Pre-diabetes    Substance abuse (HCC)    etoh,but have stopped updated 05/06/21   Urticaria     PAST SURGICAL HISTORY: Past Surgical History:  Procedure Laterality Date   CATARACT EXTRACTION W/ INTRAOCULAR LENS  IMPLANT, BILATERAL Bilateral 02/04/2008   Dr. Oneal Deputy   COLONOSCOPY     EYE SURGERY     POLYPECTOMY     PROSTATE SURGERY  02/03/2005   Biopsy/Dr. Grapey,did not find anything updated 05/06/21     FAMILY HISTORY: The patient family history includes Asthma in his mother; Hypertension in his father.  SOCIAL HISTORY:  The patient  reports that he quit smoking about 15 months ago. His smoking use included cigarettes. He has a 106.00 pack-year smoking history. He has been exposed to tobacco smoke. He has never used smokeless tobacco. He reports that he does not currently use alcohol after a past usage of about 2.0 standard drinks of alcohol per week. He reports that he does not use drugs.  REVIEW OF SYSTEMS: Review of Systems  Cardiovascular:  Positive for dyspnea on exertion. Negative for chest pain, claudication, irregular heartbeat, leg swelling, near-syncope, orthopnea, palpitations, paroxysmal nocturnal dyspnea and syncope.  Respiratory:  Positive for cough, shortness of breath, sleep disturbances due to breathing and snoring.   Hematologic/Lymphatic: Negative for bleeding problem.  Musculoskeletal:  Negative for muscle cramps and myalgias.  Neurological:  Negative for dizziness and light-headedness.    PHYSICAL EXAM:    05/30/2022   11:03 AM 05/27/2021    2:32 PM 05/27/2021    2:22 PM  Vitals with BMI  Height 6\' 4"     Weight 245 lbs    BMI 29.83    Systolic 139 120 161  Diastolic 93 64 67  Pulse 68 56 138    Physical Exam  Constitutional: No distress. He appears chronically ill.  Age appropriate, hemodynamically stable.   Neck: No JVD present.  Cardiovascular: Normal rate, regular rhythm, S1 normal, S2 normal, intact distal pulses and normal pulses. Exam reveals no gallop, no S3 and no S4.  No murmur heard. Pulses:      Dorsalis pedis pulses are 2+ on the right side and 2+ on the left side.       Posterior tibial pulses are 2+ on the right side and 2+ on the left side.  Pulmonary/Chest: Effort normal. No stridor. He has no wheezes. He has no rales.  Decreased breath sound bilateral  Abdominal: Soft. Bowel sounds are normal. He exhibits no distension. There is no  abdominal tenderness.  Musculoskeletal:        General: Edema (bilateral ankle) present.     Cervical back: Neck supple.  Neurological: He is alert and oriented to person, place, and time. He has intact cranial nerves (2-12).  Skin: Skin is warm and moist.    RADIOLOGY: CT chest lung cancer screening:  05/08/2022 1. Lung-RADS 2, benign appearance or behavior. Continue annual screening with low-dose chest CT without contrast in 12 months. 2. Pulmonary artery enlargement suggests pulmonary arterial hypertension. 3. Borderline to mild prevascular adenopathy, favored to be reactive. Recommend attention on follow-up. 4. Aortic Atherosclerosis (ICD10-I70.0) and Emphysema (ICD10-J43.9). Coronary artery atherosclerosis.   CARDIAC DATABASE: EKG: May 30, 2022: Normal sinus rhythm, 63 bpm, ST segment elevations consistent with repolarization variant, without underlying ischemia or injury pattern.  Echocardiogram: No results found for this  or any previous visit from the past 1095 days.    Stress Testing: No results found for this or any previous visit from the past 1095 days.   Heart Catheterization: None   Ultrasound of the abdomen AAA screening: 10/22/2021 Abdominal aortic aneurysm measuring maximum 3.4 cm. Recommend  follow-up ultrasound every 3 years. This recommendation follows ACR consensus guidelines: [place] Paper of the ACR Incidental Findings Committee II on Vascular Findings. J Am Coll Radiol [phone].   LABORATORY DATA:    Latest Ref Rng & Units 12/22/2019    7:52 AM 10/15/2016    1:33 AM 06/23/2014    9:46 AM  CBC  WBC 4.0 - 10.5 K/uL 7.0  5.6  6.3   Hemoglobin 13.0 - 17.0 g/dL 36.6  44.0  34.7   Hematocrit 39.0 - 52.0 % 38.5  43.9  46.2   Platelets 150 - 400 K/uL 243  166  242        Latest Ref Rng & Units 12/22/2019    7:52 AM 10/15/2016    1:33 AM 06/23/2014    9:46 AM  CMP  Glucose 70 - 99 mg/dL 425  95  81   BUN 8 - 23 mg/dL 16  11  9    Creatinine  0.61 - 1.24 mg/dL 9.56  3.87  5.64   Sodium 135 - 145 mmol/L 135  135  139   Potassium 3.5 - 5.1 mmol/L 4.5  3.4  4.5   Chloride 98 - 111 mmol/L 101  103  103   CO2 22 - 32 mmol/L 26  26  29    Calcium 8.9 - 10.3 mg/dL 9.2  9.0  9.6   Total Protein 6.5 - 8.1 g/dL 6.2  6.5  7.2   Total Bilirubin 0.3 - 1.2 mg/dL 0.8  0.6  0.4   Alkaline Phos 38 - 126 U/L 59  56  84   AST 15 - 41 U/L 18  26  17    ALT 0 - 44 U/L 21  26  18      Lipid Panel     Component Value Date/Time   CHOL 182 06/23/2014 0946   TRIG 135 06/23/2014 0946   HDL 61 06/23/2014 0946   CHOLHDL 3.0 06/23/2014 0946   VLDL 27 06/23/2014 0946   LDLCALC 94 06/23/2014 0946    No components found for: "NTPROBNP" No results for input(s): "PROBNP" in the last 8760 hours. No results for input(s): "TSH" in the last 8760 hours.  BMP No results for input(s): "NA", "K", "CL", "CO2", "GLUCOSE", "BUN", "CREATININE", "CALCIUM", "GFRNONAA", "GFRAA" in the last 8760 hours.  HEMOGLOBIN A1C Lab Results  Component Value Date   HGBA1C 5.8 (H) 06/23/2014   MPG 120 (H) 06/23/2014   External Labs: Collected: May 13, 2022 provided by PCP BUN 9, creatinine 0.79. Sodium 139, potassium 3.8, chloride 101, bicarb 33. AST 12, ALT 14, alkaline phosphatase 102 Hemoglobin 14.3, hematocrit 43.9%   IMPRESSION:    ICD-10-CM   1. Dyspnea on exertion  R06.09 EKG 12-Lead    PCV ECHOCARDIOGRAM COMPLETE    PCV MYOCARDIAL PERFUSION WITH LEXISCAN    2. Calcification of native coronary artery  I25.10 PCV ECHOCARDIOGRAM COMPLETE   I25.84 PCV MYOCARDIAL PERFUSION WITH LEXISCAN    3. Atherosclerosis of aorta (HCC)  I70.0 PCV ECHOCARDIOGRAM COMPLETE    PCV MYOCARDIAL PERFUSION WITH LEXISCAN    4. Abdominal aortic aneurysm (AAA) without rupture, unspecified part (HCC)  I71.40     5. Primary hypertension  I10  6. Mixed hyperlipidemia  E78.2 Lipid Panel With LDL/HDL Ratio    LDL cholesterol, direct    CMP14+EGFR    7. Former smoker  Z87.891      8. Snoring  R06.83 Ambulatory referral to Sleep Studies       RECOMMENDATIONS: Victor Pham is a 72 y.o. Caucasian male whose past medical history and cardiac risk factors include: former smoker ( quit 11/2021, atleast 1.5 ppd for 55 years), hx of alcohol abuse (quit 2021), HTN, HLD, Prediabetic, AAA (3.4cm as of 10/22/2021), anxiety / depression.   Dyspnea on exertion Multifactorial. Likely secondary to prolonged history of smoking/underlying COPD/emphysema Currently on inhalers as per his wife. Will proceed with ischemic workup for now. Has appointment with pulmonary medicine in the coming months  Calcification of native coronary artery Atherosclerosis of aorta (HCC) Noted on lung cancer screening. EKG nonischemic. Echo will be ordered to evaluate for structural heart disease and left ventricular systolic function. Nuclear stress test recommended to evaluate for reversible ischemia. Continue aspirin 81 mg p.o. daily. Continue statin therapy  Abdominal aortic aneurysm (AAA) without rupture, unspecified part (HCC) Performed as part of USPSTF AAA screening for males greater than 65 with history of smoking. Results reviewed. Currently managed by primary care provider.  Primary hypertension Blood pressures are within acceptable range, but not at goal. Currently managed by PCP. Reemphasized importance of low-salt diet.  Mixed hyperlipidemia Currently on Crestor. Do not have the most recent lipid profile available for review. Will check fasting lipid profile and direct LDL.  Former smoker Quit in October 2023. Reemphasized the importance of complete continued smoking cessation AAA screening has been performed and will be followed up by PCP, per patient's wife. Lung cancer screening performed as noted above Will follow-up with pulmonary medicine given his dyspnea and workup of COPD/emphysema. Denies symptoms of claudication  Snoring Wife endorses concerns with witnessed  apneic episodes at night. Will place a referral for sleep medicine to evaluate for sleep apnea  Data Reviewed: I have independently reviewed external notes provided by the referring provider as part of this office visit.   I have independently reviewed results of EKG, labs,  as part of medical decision making. I have ordered the following tests:  Orders Placed This Encounter  Procedures   Lipid Panel With LDL/HDL Ratio   LDL cholesterol, direct   [id number]   Ambulatory referral to Sleep Studies    Referral Priority:   Routine    Referral Type:   Consultation    Referral Reason:   Specialty Services Required    Referred to Provider:   Alanda Slim, MD    Number of Visits Requested:   1   PCV MYOCARDIAL PERFUSION WITH LEXISCAN    Standing Status:   Future    Standing Expiration Date:   05/30/2023   EKG 12-Lead   PCV ECHOCARDIOGRAM COMPLETE    Standing Status:   Future    Standing Expiration Date:   05/30/2023   I have not made medications changes at today's encounter as noted above. History of present illness was obtained by both patient and wife  at today's office visit.   FINAL MEDICATION LIST END OF ENCOUNTER: No orders of the defined types were placed in this encounter.   Medications Discontinued During This Encounter  Medication Reason   FLUoxetine (PROZAC) 10 MG capsule    montelukast (SINGULAIR) 10 MG tablet    Oxycodone HCl 10 MG TABS    sildenafil (REVATIO) 20 MG tablet  bisoprolol-hydrochlorothiazide (ZIAC) 5-6.25 MG tablet    citalopram (CELEXA) 40 MG tablet      Current Outpatient Medications:    albuterol (PROVENTIL HFA;VENTOLIN HFA) 108 (90 BASE) MCG/ACT inhaler, Inhale 1-2 puffs into the lungs every 6 (six) hours as needed for wheezing or shortness of breath., Disp: 18 g, Rfl: 2   aspirin 81 MG tablet, Take 81 mg by mouth daily., Disp: , Rfl:    benzonatate (TESSALON) 200 MG capsule, Take 200 mg by mouth 2 (two) times daily as needed., Disp: , Rfl:     bisoprolol-hydrochlorothiazide (ZIAC) 5-6.25 MG per tablet, Take 1 tablet every morning for BP, Disp: 90 tablet, Rfl: 1   clonazePAM (KLONOPIN) 2 MG tablet, TAKE 1 TABLET BY MOUTH 3 TIMES DAILY (Patient taking differently: Take 1 mg by mouth as needed.), Disp: 90 tablet, Rfl: 0   CRESTOR 40 MG tablet, TAKE 1 TABLET BY MOUTH DAILY, Disp: 30 tablet, Rfl: 2   FLUoxetine (PROZAC) 20 MG capsule, Take 20 mg by mouth daily., Disp: , Rfl:    fluticasone (FLONASE) 50 MCG/ACT nasal spray, Place 2 sprays into both nostrils daily., Disp: , Rfl:    ipratropium-albuterol (DUONEB) 0.5-2.5 (3) MG/3ML SOLN, Inhale 3 mLs into the lungs every 2 (two) hours as needed., Disp: , Rfl:    omeprazole (PRILOSEC) 40 MG capsule, Take 40 mg by mouth daily., Disp: , Rfl:    tamsulosin (FLOMAX) 0.4 MG CAPS capsule, Take 0.4 mg by mouth daily., Disp: , Rfl:    triamcinolone cream (KENALOG) 0.1 %, Apply topically 2 (two) times daily., Disp: , Rfl:   Orders Placed This Encounter  Procedures   Lipid Panel With LDL/HDL Ratio   LDL cholesterol, direct   [id number]   Ambulatory referral to Sleep Studies   PCV MYOCARDIAL PERFUSION WITH LEXISCAN   EKG 12-Lead   PCV ECHOCARDIOGRAM COMPLETE    There are no Patient Instructions on file for this visit.   --Continue cardiac medications as reconciled in final medication list. --Return in about 6 days (around 06/05/2022) for Follow up, Coronary artery calcification, Review test results. or sooner if needed. --Continue follow-up with your primary care physician regarding the management of your other chronic comorbid conditions.  Patient's questions and concerns were addressed to his satisfaction. He voices understanding of the instructions provided during this encounter.   This note was created using a voice recognition software as a result there may be grammatical errors inadvertently enclosed that do not reflect the nature of this encounter. Every attempt is made to correct such  errors.  Tessa Lerner, [place], [facility]  Pager:  [phone] Office: [phone]

## 2022-05-31 LAB — CMP14+EGFR
ALT: 16 IU/L (ref 0–44)
AST: 14 IU/L (ref 0–40)
Albumin/Globulin Ratio: 1.6 (ref 1.2–2.2)
Albumin: 4.2 g/dL (ref 3.9–4.9)
Alkaline Phosphatase: 85 IU/L (ref 44–121)
BUN/Creatinine Ratio: 17 (ref 10–24)
BUN: 12 mg/dL (ref 8–27)
Bilirubin Total: 0.2 mg/dL (ref 0.0–1.2)
CO2: 23 mmol/L (ref 20–29)
Calcium: 9.5 mg/dL (ref 8.6–10.2)
Chloride: 102 mmol/L (ref 96–106)
Creatinine, Ser: 0.71 mg/dL — ABNORMAL LOW (ref 0.76–1.27)
Globulin, Total: 2.7 g/dL (ref 1.5–4.5)
Glucose: 101 mg/dL — ABNORMAL HIGH (ref 70–99)
Potassium: 4.6 mmol/L (ref 3.5–5.2)
Sodium: 138 mmol/L (ref 134–144)
Total Protein: 6.9 g/dL (ref 6.0–8.5)
eGFR: 99 mL/min/{1.73_m2} (ref >59)

## 2022-05-31 LAB — LIPID PANEL WITH LDL/HDL RATIO
Cholesterol, Total: 165 mg/dL (ref 100–199)
HDL: 65 mg/dL (ref >39)
LDL Chol Calc (NIH): 75 mg/dL (ref 0–99)
LDL/HDL Ratio: 1.2 ratio (ref 0.0–3.6)
Triglycerides: 145 mg/dL (ref 0–149)
VLDL Cholesterol Cal: 25 mg/dL (ref 5–40)

## 2022-05-31 LAB — LDL CHOLESTEROL, DIRECT: LDL Direct: 73 mg/dL (ref 0–99)

## 2022-06-16 ENCOUNTER — Ambulatory Visit: Payer: Medicare Other

## 2022-06-16 DIAGNOSIS — R0609 Other forms of dyspnea: Secondary | ICD-10-CM

## 2022-06-16 DIAGNOSIS — I2510 Atherosclerotic heart disease of native coronary artery without angina pectoris: Secondary | ICD-10-CM

## 2022-06-16 DIAGNOSIS — I700 Atherosclerosis of aorta: Secondary | ICD-10-CM

## 2022-07-01 NOTE — Progress Notes (Signed)
Called patient wife answered and was informed about his stress test.

## 2022-07-03 ENCOUNTER — Ambulatory Visit: Payer: Medicare Other

## 2022-07-03 DIAGNOSIS — I700 Atherosclerosis of aorta: Secondary | ICD-10-CM

## 2022-07-03 DIAGNOSIS — I2510 Atherosclerotic heart disease of native coronary artery without angina pectoris: Secondary | ICD-10-CM

## 2022-07-03 DIAGNOSIS — R0609 Other forms of dyspnea: Secondary | ICD-10-CM

## 2022-07-08 NOTE — Progress Notes (Signed)
Called patient no answer left a vm

## 2022-07-10 ENCOUNTER — Encounter: Payer: Self-pay | Admitting: Cardiology

## 2022-07-10 ENCOUNTER — Ambulatory Visit: Payer: Medicare Other | Admitting: Cardiology

## 2022-07-10 VITALS — BP 106/67 | HR 80 | Ht 76.0 in | Wt 242.0 lb

## 2022-07-10 DIAGNOSIS — I2510 Atherosclerotic heart disease of native coronary artery without angina pectoris: Secondary | ICD-10-CM

## 2022-07-10 DIAGNOSIS — R0683 Snoring: Secondary | ICD-10-CM

## 2022-07-10 DIAGNOSIS — I10 Essential (primary) hypertension: Secondary | ICD-10-CM

## 2022-07-10 DIAGNOSIS — R0609 Other forms of dyspnea: Secondary | ICD-10-CM

## 2022-07-10 DIAGNOSIS — I714 Abdominal aortic aneurysm, without rupture, unspecified: Secondary | ICD-10-CM

## 2022-07-10 DIAGNOSIS — E782 Mixed hyperlipidemia: Secondary | ICD-10-CM

## 2022-07-10 DIAGNOSIS — I700 Atherosclerosis of aorta: Secondary | ICD-10-CM

## 2022-07-10 DIAGNOSIS — Z87891 Personal history of nicotine dependence: Secondary | ICD-10-CM

## 2022-07-10 MED ORDER — EZETIMIBE 10 MG PO TABS: 10.0000 mg | ORAL_TABLET | Freq: Every day | ORAL | 3 refills | Status: DC

## 2022-07-10 NOTE — Progress Notes (Signed)
ID:  Victor Pham, DOB 01/22/1951, MRN [id number]  PCP:  Victor Bussing, MD  Cardiologist:  Victor Lerner, DO, [facility] (established care May 30, 2022)  Date: 07/10/22 Last Office Visit: 05/30/2022  Chief Complaint  Patient presents with   Dyspnea on exertion   Follow-up    HPI  Victor Pham is a 72 y.o. Caucasian male whose past medical history and cardiovascular risk factors include: Coronary artery calcification, aortic atherosclerosis, former smoker ( quit 11/2021, atleast 1.5 ppd for 55 years), chronic hypoxia on supplemental oxygen, hx of alcohol abuse (quit 2021), HTN, HLD, Prediabetic, AAA (3.4cm as of 10/22/2021), anxiety / depression.   Patient was referred to the practice for evaluation of coronary calcification and aortic atherosclerosis.  He is accompanied by his wife at today's office visit.  As part of his lung cancer screening he did undergo CT scan which noted coronary artery calcification as well as aortic atherosclerosis.  Patient was referred to cardiology for further evaluation and management.  At the last office visit recommended echocardiography and stress test for further risk stratification and uptitration of medical therapy.  Results of echo and stress test reviewed with the patient and his wife at today's visit as noted below for further reference.  Clinically denies anginal chest pain or heart failure symptoms.  They have establish care with pulmonary medicine and is now on 2 L nasal cannula oxygen at rest and 3 L with exertion.  He plans to have a sleep study with his pulmonologist as well to evaluate for sleep apnea.   FUNCTIONAL STATUS: No structured exercise program or daily routine.   ALLERGIES: Allergies  Allergen Reactions   Lipitor [Atorvastatin]    Penicillins     REACTION: hives    MEDICATION LIST PRIOR TO VISIT: Current Meds  Medication Sig   albuterol (PROVENTIL HFA;VENTOLIN HFA) 108 (90 BASE) MCG/ACT inhaler Inhale 1-2 puffs into the  lungs every 6 (six) hours as needed for wheezing or shortness of breath.   aspirin 81 MG tablet Take 81 mg by mouth daily.   benzonatate (TESSALON) 200 MG capsule Take 200 mg by mouth 2 (two) times daily as needed.   bisoprolol-hydrochlorothiazide (ZIAC) 5-6.25 MG per tablet Take 1 tablet every morning for BP   clonazePAM (KLONOPIN) 2 MG tablet TAKE 1 TABLET BY MOUTH 3 TIMES DAILY (Patient taking differently: Take 1 mg by mouth as needed.)   CRESTOR 40 MG tablet TAKE 1 TABLET BY MOUTH DAILY   ezetimibe (ZETIA) 10 MG tablet Take 1 tablet (10 mg total) by mouth daily.   FLUoxetine (PROZAC) 20 MG capsule Take 20 mg by mouth daily.   fluticasone (FLONASE) 50 MCG/ACT nasal spray Place 2 sprays into both nostrils daily.   Fluticasone-Umeclidin-Vilant (TRELEGY ELLIPTA) 100-62.5-25 MCG/ACT AEPB Inhale 1 puff into the lungs daily at 2 PM.   furosemide (LASIX) 20 MG tablet Take 1 tablet by mouth daily.   ipratropium-albuterol (DUONEB) 0.5-2.5 (3) MG/3ML SOLN Inhale 3 mLs into the lungs every 2 (two) hours as needed.   omeprazole (PRILOSEC) 40 MG capsule Take 40 mg by mouth daily.   tamsulosin (FLOMAX) 0.4 MG CAPS capsule Take 0.4 mg by mouth daily.   triamcinolone cream (KENALOG) 0.1 % Apply topically 2 (two) times daily.     PAST MEDICAL HISTORY: Past Medical History:  Diagnosis Date   AAA (abdominal aortic aneurysm) (HCC)    Anxiety    Arthritis    back   Asthma    Atherosclerosis of aorta (  HCC)    Cataract    removed   Coronary atherosclerosis due to calcified coronary lesion    Depression    Hyperlipidemia    Hypertension    Personal history of adenomatous colonic polyps 12/28/2011   01/2009 = 3 diminutive adenomas    Pre-diabetes    Substance abuse (HCC)    etoh,but have stopped updated 05/06/21   Urticaria     PAST SURGICAL HISTORY: Past Surgical History:  Procedure Laterality Date   CATARACT EXTRACTION W/ INTRAOCULAR LENS  IMPLANT, BILATERAL Bilateral 02/04/2008   Dr. Oneal Pham    COLONOSCOPY     EYE SURGERY     POLYPECTOMY     PROSTATE SURGERY  02/03/2005   Biopsy/Dr. Grapey,did not find anything updated 05/06/21    FAMILY HISTORY: The patient family history includes Asthma in his mother; Hypertension in his father.  SOCIAL HISTORY:  The patient  reports that he quit smoking about 17 months ago. His smoking use included cigarettes. He has a 106.00 pack-year smoking history. He has been exposed to tobacco smoke. He has never used smokeless tobacco. He reports that he does not currently use alcohol after a past usage of about 2.0 standard drinks of alcohol per week. He reports that he does not use drugs.  REVIEW OF SYSTEMS: Review of Systems  Cardiovascular:  Positive for dyspnea on exertion. Negative for chest pain, claudication, irregular heartbeat, leg swelling, near-syncope, orthopnea, palpitations, paroxysmal nocturnal dyspnea and syncope.  Respiratory:  Positive for shortness of breath, sleep disturbances due to breathing and snoring.   Hematologic/Lymphatic: Negative for bleeding problem.  Musculoskeletal:  Negative for muscle cramps and myalgias.  Neurological:  Negative for dizziness and light-headedness.    PHYSICAL EXAM:    07/10/2022    2:58 PM 05/30/2022   11:03 AM 05/27/2021    2:32 PM  Vitals with BMI  Height 6\' 4"  6\' 4"    Weight 242 lbs 245 lbs   BMI 29.47 29.83   Systolic 106 139 161  Diastolic 67 93 64  Pulse 80 68 56    Physical Exam  Constitutional: No distress. He appears chronically ill.  Age appropriate, hemodynamically stable.   Neck: No JVD present.  Cardiovascular: Normal rate, regular rhythm, S1 normal, S2 normal, intact distal pulses and normal pulses. Exam reveals no gallop, no S3 and no S4.  No murmur heard. Pulses:      Dorsalis pedis pulses are 2+ on the right side and 2+ on the left side.       Posterior tibial pulses are 2+ on the right side and 2+ on the left side.  Pulmonary/Chest: Effort normal. No stridor. He has  no wheezes. He has no rales.  Decreased breath sound bilateral  Abdominal: Soft. Bowel sounds are normal. He exhibits no distension. There is no abdominal tenderness.  Musculoskeletal:        General: Edema (bilateral ankle) present.     Cervical back: Neck supple.  Neurological: He is alert and oriented to person, place, and time. He has intact cranial nerves (2-12).  Skin: Skin is warm and moist.    RADIOLOGY: CT chest lung cancer screening:  05/08/2022 1. Lung-RADS 2, benign appearance or behavior. Continue annual screening with low-dose chest CT without contrast in 12 months. 2. Pulmonary artery enlargement suggests pulmonary arterial hypertension. 3. Borderline to mild prevascular adenopathy, favored to be reactive. Recommend attention on follow-up. 4. Aortic Atherosclerosis (ICD10-I70.0) and Emphysema (ICD10-J43.9). Coronary artery atherosclerosis.   CARDIAC DATABASE: EKG: May 30, 2022: Normal sinus rhythm, 63 bpm, ST segment elevations consistent with repolarization variant, without underlying ischemia or injury pattern.  Echocardiogram: 07/03/2022: Left ventricle cavity is normal in size. Normal left ventricular wall thickness. Mild global hypokinesis. LVEF 45-50%. Doppler evidence of grade I (impaired) diastolic dysfunction, normal LAP. Personally reviewed and LVEF 50-55%.  The aortic root is mildly dilated at 4.0 cm at sinus of Valsalva. Mildly dilated proximal ascending aorta at 3.9 cm. No significant valvular abnormality. No evidence of pulmonary hypertension.    Stress Testing: Regadenoson (with Mod Bruce protocol) Nuclear stress test 06/16/2022 Myocardial perfusion is normal. Diaphragmatic attenuation is present. Overall LV systolic function is normal without regional wall motion abnormalities. Stress LV EF: 50%. Low risk study. Nondiagnostic ECG stress. The heart rate response was consistent with Regadenoson. The blood pressure response was physiologic. No previous  exam available for comparison.   Heart Catheterization: None   Ultrasound of the abdomen AAA screening: 10/22/2021 Abdominal aortic aneurysm measuring maximum 3.4 cm. Recommend  follow-up ultrasound every 3 years. This recommendation follows ACR consensus guidelines: [place] Paper of the ACR Incidental Findings Committee II on Vascular Findings. J Am Coll Radiol [phone].   LABORATORY DATA:    Latest Ref Rng & Units 12/22/2019    7:52 AM 10/15/2016    1:33 AM 06/23/2014    9:46 AM  CBC  WBC 4.0 - 10.5 K/uL 7.0  5.6  6.3   Hemoglobin 13.0 - 17.0 g/dL 81.1  91.4  78.2   Hematocrit 39.0 - 52.0 % 38.5  43.9  46.2   Platelets 150 - 400 K/uL 243  166  242        Latest Ref Rng & Units 05/30/2022   12:36 PM 12/22/2019    7:52 AM 10/15/2016    1:33 AM  CMP  Glucose 70 - 99 mg/dL 956  213  95   BUN 8 - 27 mg/dL 12  16  11    Creatinine 0.76 - 1.27 mg/dL 0.86  5.78  4.69   Sodium 134 - 144 mmol/L 138  135  135   Potassium 3.5 - 5.2 mmol/L 4.6  4.5  3.4   Chloride 96 - 106 mmol/L 102  101  103   CO2 20 - 29 mmol/L 23  26  26    Calcium 8.6 - 10.2 mg/dL 9.5  9.2  9.0   Total Protein 6.0 - 8.5 g/dL 6.9  6.2  6.5   Total Bilirubin 0.0 - 1.2 mg/dL 0.2  0.8  0.6   Alkaline Phos 44 - 121 IU/L 85  59  56   AST 0 - 40 IU/L 14  18  26    ALT 0 - 44 IU/L 16  21  26      Lipid Panel  Lab Results  Component Value Date   CHOL 165 05/30/2022   HDL 65 05/30/2022   LDLCALC 75 05/30/2022   LDLDIRECT 73 05/30/2022   TRIG 145 05/30/2022   CHOLHDL 3.0 06/23/2014     No components found for: "NTPROBNP" No results for input(s): "PROBNP" in the last 8760 hours. No results for input(s): "TSH" in the last 8760 hours.  BMP Recent Labs    05/30/22 1236  NA 138  K 4.6  CL 102  CO2 23  GLUCOSE 101*  BUN 12  CREATININE 0.71*  CALCIUM 9.5    HEMOGLOBIN A1C Lab Results  Component Value Date   HGBA1C 5.8 (H) 06/23/2014   MPG 120 (H) 06/23/2014   External Labs:  Collected: May 13, 2022 provided by PCP BUN 9, creatinine 0.79. Sodium 139, potassium 3.8, chloride 101, bicarb 33. AST 12, ALT 14, alkaline phosphatase 102 Hemoglobin 14.3, hematocrit 43.9%   IMPRESSION:    ICD-10-CM   1. Dyspnea on exertion  R06.09     2. Calcification of native coronary artery  I25.10 ezetimibe (ZETIA) 10 MG tablet   I25.84 Lipid Panel With LDL/HDL Ratio    LDL cholesterol, direct    CMP14+EGFR    CMP14+EGFR    LDL cholesterol, direct    Lipid Panel With LDL/HDL Ratio    3. Atherosclerosis of aorta (HCC)  I70.0 ezetimibe (ZETIA) 10 MG tablet    Lipid Panel With LDL/HDL Ratio    LDL cholesterol, direct    CMP14+EGFR    CMP14+EGFR    LDL cholesterol, direct    Lipid Panel With LDL/HDL Ratio    4. Abdominal aortic aneurysm (AAA) without rupture, unspecified part (HCC)  I71.40     5. Primary hypertension  I10     6. Mixed hyperlipidemia  E78.2     7. Former smoker  Z87.891     8. Snoring  R06.83        RECOMMENDATIONS: Victor Pham is a 72 y.o. Caucasian male whose past medical history and cardiac risk factors include: Coronary artery calcification, aortic atherosclerosis, former smoker ( quit 11/2021, atleast 1.5 ppd for 55 years), chronic hypoxia on supplemental oxygen, hx of alcohol abuse (quit 2021), HTN, HLD, Prediabetic, AAA (3.4cm as of 10/22/2021), anxiety / depression.   Dyspnea on exertion Multifactorial. Likely secondary to prolonged history of smoking/underlying COPD/emphysema. Has establish care with pulmonary medicine and is now on oxygen therapy 2 L at rest and 3 L with exertion Currently on inhalers as per his wife. Ischemic workup essentially unremarkable for now. Independently reviewed his echocardiography and his LVEF is 50-55%.  Calcification of native coronary artery Atherosclerosis of aorta (HCC) Noted on lung cancer screening. EKG nonischemic. Echo: Personally reviewed LVEF 50-55%, grade 1 diastolic impairment, see report for additional  details Stress test: Low risk study see report for additional details Continue aspirin and statin therapy. On current statin medications LDL is greater than 55 mg/dL. Will start Zetia 10 mg p.o. daily.  Medication profile discussed.  Repeat lipids in 6 weeks to reevaluate therapy. Educated him on the importance of secondary prevention.  Abdominal aortic aneurysm (AAA) without rupture, unspecified part (HCC) Performed as part of USPSTF AAA screening for males greater than 65 with history of smoking. Results reviewed. Currently managed by primary care provider.  Primary hypertension Blood pressures are within acceptable range. Currently managed by PCP. Reemphasized importance of low-salt diet.  Mixed hyperlipidemia Currently on Crestor.   He denies myalgia or other side effects. Most recent lipids dated April 2024, independently reviewed as noted above. Given his comorbid conditions recommend a goal LDL of 55 mg/dL if able to tolerate/achieve. Already on maximally tolerated dose of statin therapy. Start Zetia 10 mg p.o. daily as discussed above   Former smoker Quit in October 2023. Reemphasized the importance of complete continued smoking cessation AAA screening has been performed and will be followed up by PCP, per patient's wife. Lung cancer screening performed as noted above Will follow-up with pulmonary medicine given his dyspnea and workup of COPD/emphysema. Denies symptoms of claudication  Snoring Wife endorses concerns with witnessed apneic episodes at night. Will place a referral for sleep medicine to evaluate for sleep apnea  Data Reviewed: I have independently reviewed  external notes provided by the referring provider as part of this office visit.   I have independently reviewed results of EKG, labs,  as part of medical decision making. I have ordered the following tests:  Orders Placed This Encounter  Procedures   Lipid Panel With LDL/HDL Ratio    Standing  Status:   Future    Number of Occurrences:   1    Standing Expiration Date:   07/10/2023   LDL cholesterol, direct    Standing Status:   Future    Number of Occurrences:   1    Standing Expiration Date:   07/10/2023   CMP14+EGFR    Standing Status:   Future    Number of Occurrences:   1    Standing Expiration Date:   07/10/2023   I have not made medications changes at today's encounter as noted above. History of present illness was obtained by both patient and wife  at today's office visit.   FINAL MEDICATION LIST END OF ENCOUNTER: Meds ordered this encounter  Medications   ezetimibe (ZETIA) 10 MG tablet    Sig: Take 1 tablet (10 mg total) by mouth daily.    Dispense:  90 tablet    Refill:  3    There are no discontinued medications.    Current Outpatient Medications:    albuterol (PROVENTIL HFA;VENTOLIN HFA) 108 (90 BASE) MCG/ACT inhaler, Inhale 1-2 puffs into the lungs every 6 (six) hours as needed for wheezing or shortness of breath., Disp: 18 g, Rfl: 2   aspirin 81 MG tablet, Take 81 mg by mouth daily., Disp: , Rfl:    benzonatate (TESSALON) 200 MG capsule, Take 200 mg by mouth 2 (two) times daily as needed., Disp: , Rfl:    bisoprolol-hydrochlorothiazide (ZIAC) 5-6.25 MG per tablet, Take 1 tablet every morning for BP, Disp: 90 tablet, Rfl: 1   clonazePAM (KLONOPIN) 2 MG tablet, TAKE 1 TABLET BY MOUTH 3 TIMES DAILY (Patient taking differently: Take 1 mg by mouth as needed.), Disp: 90 tablet, Rfl: 0   CRESTOR 40 MG tablet, TAKE 1 TABLET BY MOUTH DAILY, Disp: 30 tablet, Rfl: 2   ezetimibe (ZETIA) 10 MG tablet, Take 1 tablet (10 mg total) by mouth daily., Disp: 90 tablet, Rfl: 3   FLUoxetine (PROZAC) 20 MG capsule, Take 20 mg by mouth daily., Disp: , Rfl:    fluticasone (FLONASE) 50 MCG/ACT nasal spray, Place 2 sprays into both nostrils daily., Disp: , Rfl:    Fluticasone-Umeclidin-Vilant (TRELEGY ELLIPTA) 100-62.5-25 MCG/ACT AEPB, Inhale 1 puff into the lungs daily at 2 PM., Disp: ,  Rfl:    furosemide (LASIX) 20 MG tablet, Take 1 tablet by mouth daily., Disp: , Rfl:    ipratropium-albuterol (DUONEB) 0.5-2.5 (3) MG/3ML SOLN, Inhale 3 mLs into the lungs every 2 (two) hours as needed., Disp: , Rfl:    omeprazole (PRILOSEC) 40 MG capsule, Take 40 mg by mouth daily., Disp: , Rfl:    tamsulosin (FLOMAX) 0.4 MG CAPS capsule, Take 0.4 mg by mouth daily., Disp: , Rfl:    triamcinolone cream (KENALOG) 0.1 %, Apply topically 2 (two) times daily., Disp: , Rfl:   Orders Placed This Encounter  Procedures   Lipid Panel With LDL/HDL Ratio   LDL cholesterol, direct   [id number]    There are no Patient Instructions on file for this visit.   --Continue cardiac medications as reconciled in final medication list. --Return in about 6 months (around 01/09/2023) for Follow up, Coronary artery calcification,  Lipid. or sooner if needed. --Continue follow-up with your primary care physician regarding the management of your other chronic comorbid conditions.  Patient's questions and concerns were addressed to his satisfaction. He voices understanding of the instructions provided during this encounter.   This note was created using a voice recognition software as a result there may be grammatical errors inadvertently enclosed that do not reflect the nature of this encounter. Every attempt is made to correct such errors.  Victor Pham, [place], [facility]  Pager:  [phone] Office: [phone]

## 2022-10-24 ENCOUNTER — Ambulatory Visit: Payer: Medicare Other | Admitting: Internal Medicine

## 2023-01-09 ENCOUNTER — Ambulatory Visit: Payer: Self-pay | Admitting: Cardiology

## 2023-01-12 ENCOUNTER — Ambulatory Visit: Payer: Medicare Other | Attending: Cardiology | Admitting: Cardiology

## 2023-01-12 ENCOUNTER — Encounter: Payer: Self-pay | Admitting: Cardiology

## 2023-01-12 VITALS — BP 128/80 | HR 57 | Resp 16 | Ht 76.0 in | Wt 241.0 lb

## 2023-01-12 DIAGNOSIS — I700 Atherosclerosis of aorta: Secondary | ICD-10-CM | POA: Diagnosis present

## 2023-01-12 DIAGNOSIS — I10 Essential (primary) hypertension: Secondary | ICD-10-CM | POA: Diagnosis present

## 2023-01-12 DIAGNOSIS — Z87891 Personal history of nicotine dependence: Secondary | ICD-10-CM | POA: Diagnosis present

## 2023-01-12 DIAGNOSIS — E782 Mixed hyperlipidemia: Secondary | ICD-10-CM | POA: Insufficient documentation

## 2023-01-12 DIAGNOSIS — I2510 Atherosclerotic heart disease of native coronary artery without angina pectoris: Secondary | ICD-10-CM | POA: Insufficient documentation

## 2023-01-12 DIAGNOSIS — I714 Abdominal aortic aneurysm, without rupture, unspecified: Secondary | ICD-10-CM | POA: Insufficient documentation

## 2023-01-12 DIAGNOSIS — I2584 Coronary atherosclerosis due to calcified coronary lesion: Secondary | ICD-10-CM | POA: Diagnosis present

## 2023-01-12 DIAGNOSIS — R0609 Other forms of dyspnea: Secondary | ICD-10-CM | POA: Insufficient documentation

## 2023-01-12 NOTE — Patient Instructions (Signed)

## 2023-01-12 NOTE — Progress Notes (Signed)
Cardiology Office Note:  .   Date:  01/12/2023  ID:  Victor Pham, DOB 1951-01-14, MRN [id number] PCP:  Darrow Bussing, MD  Former Cardiology Providers: None [facility] Providers Cardiologist:  Tessa Lerner, DO , [facility] (established care April 2024) Electrophysiologist:  None  Click to update primary MD,subspecialty MD or APP then REFRESH:1}    Chief Complaint  Patient presents with   Dyspnea on exertion   Follow-up    6 months    History of Present Illness: .   Victor Pham is a 72 y.o. Caucasian male whose past medical history and cardiovascular risk factors includes: Coronary artery calcification, aortic atherosclerosis, former smoker ( quit 11/2021, atleast 1.5 ppd for 55 years), chronic hypoxia on supplemental oxygen, hx of alcohol abuse (quit 2021), HTN, HLD, Prediabetic, AAA (3.4cm as of 10/22/2021), anxiety / depression.   Patient was referred to the practice for evaluation of coronary artery calcification and aortic atherosclerosis.  He underwent CT of the chest for lung cancer screening in the past and was noted to have mild coronary artery calcification and aortic atherosclerosis.  He underwent appropriate cardiovascular workup.  He continue to follow-up with pulmonary medicine and was placed on nasal cannula oxygen for chronic hypoxia.  He now presents today for 67-month follow-up visit.  Over the last 6 months he denies any anginal chest pain or heart failure symptoms.  He remains successful with regards to the sensation at smoking and drinking alcohol.  Most recent lipids from July 2024 notes a stable lipids after initiation of Zetia.   Review of Systems: .   Review of Systems  Cardiovascular:  Negative for chest pain, claudication, irregular heartbeat, leg swelling, near-syncope, orthopnea, palpitations, paroxysmal nocturnal dyspnea and syncope.  Respiratory:  Negative for shortness of breath.   Hematologic/Lymphatic: Negative for bleeding problem.    Studies  Reviewed:   EKG: EKG Interpretation Date/Time:  Monday January 12 2023 08:32:22 EST Ventricular Rate:  56 PR Interval:  218 QRS Duration:  90 QT Interval:  408 QTC Calculation: 393 R Axis:   5  Text Interpretation: Sinus bradycardia with 1st degree A-V block When compared with ECG of 15-Oct-2016 01:24, No significant change was found Confirmed by Tessa Lerner [phone]) on 01/12/2023 8:40:41 AM  Echocardiogram: 07/03/2022: Left ventricle cavity is normal in size. Normal left ventricular wall thickness. Mild global hypokinesis. LVEF 45-50%. Doppler evidence of grade I (impaired) diastolic dysfunction, normal LAP. Personally reviewed and LVEF 50-55%.  The aortic root is mildly dilated at 4.0 cm at sinus of Valsalva. Mildly dilated proximal ascending aorta at 3.9 cm. No significant valvular abnormality. No evidence of pulmonary hypertension.     Stress Testing: Regadenoson (with Mod Bruce protocol) Nuclear stress test 06/16/2022 Myocardial perfusion is normal. Diaphragmatic attenuation is present. Overall LV systolic function is normal without regional wall motion abnormalities. Stress LV EF: 50%. Low risk study. Nondiagnostic ECG stress. The heart rate response was consistent with Regadenoson. The blood pressure response was physiologic. No previous exam available for comparison.   Ultrasound of the abdomen AAA screening: 10/22/2021 Abdominal aortic aneurysm measuring maximum 3.4 cm. Recommend  follow-up ultrasound every 3 years. This recommendation follows ACR consensus guidelines: [place] Paper of the ACR Incidental Findings Committee II on Vascular Findings. J Am Coll Radiol [phone].  RADIOLOGY: CT chest lung cancer screening:  05/08/2022 1. Lung-RADS 2, benign appearance or behavior. Continue annual screening with low-dose chest CT without contrast in 12 months. 2. Pulmonary artery enlargement suggests pulmonary arterial hypertension.  3. Borderline to mild prevascular  adenopathy, favored to be reactive. Recommend attention on follow-up. 4. Aortic Atherosclerosis (ICD10-I70.0) and Emphysema (ICD10-J43.9). Coronary artery atherosclerosis.  Risk Assessment/Calculations:   NA   Labs:       Latest Ref Rng & Units 12/22/2019    7:52 AM 10/15/2016    1:33 AM 06/23/2014    9:46 AM  CBC  WBC 4.0 - 10.5 K/uL 7.0  5.6  6.3   Hemoglobin 13.0 - 17.0 g/dL 16.1  09.6  04.5   Hematocrit 39.0 - 52.0 % 38.5  43.9  46.2   Platelets 150 - 400 K/uL 243  166  242        Latest Ref Rng & Units 08/29/2022    8:53 AM 05/30/2022   12:36 PM 12/22/2019    7:52 AM  BMP  Glucose 70 - 99 mg/dL 409  811  914   BUN 8 - 27 mg/dL 13  12  16    Creatinine 0.76 - 1.27 mg/dL 7.82  9.56  2.13   BUN/Creat Ratio 10 - 24 14  17     Sodium 134 - 144 mmol/L 138  138  135   Potassium 3.5 - 5.2 mmol/L 4.5  4.6  4.5   Chloride 96 - 106 mmol/L 103  102  101   CO2 20 - 29 mmol/L 22  23  26    Calcium 8.6 - 10.2 mg/dL 9.4  9.5  9.2       Latest Ref Rng & Units 08/29/2022    8:53 AM 05/30/2022   12:36 PM 12/22/2019    7:52 AM  CMP  Glucose 70 - 99 mg/dL 086  578  469   BUN 8 - 27 mg/dL 13  12  16    Creatinine 0.76 - 1.27 mg/dL 6.29  5.28  4.13   Sodium 134 - 144 mmol/L 138  138  135   Potassium 3.5 - 5.2 mmol/L 4.5  4.6  4.5   Chloride 96 - 106 mmol/L 103  102  101   CO2 20 - 29 mmol/L 22  23  26    Calcium 8.6 - 10.2 mg/dL 9.4  9.5  9.2   Total Protein 6.0 - 8.5 g/dL 6.7  6.9  6.2   Total Bilirubin 0.0 - 1.2 mg/dL 0.4  0.2  0.8   Alkaline Phos 44 - 121 IU/L 99  85  59   AST 0 - 40 IU/L 18  14  18    ALT 0 - 44 IU/L 26  16  21      Lab Results  Component Value Date   CHOL 111 08/29/2022   HDL 44 08/29/2022   LDLCALC 50 08/29/2022   LDLDIRECT 52 08/29/2022   TRIG 85 08/29/2022   CHOLHDL 3.0 06/23/2014   No results for input(s): "LIPOA" in the last 8760 hours. No components found for: "NTPROBNP" No results for input(s): "PROBNP" in the last 8760 hours. No results for  input(s): "TSH" in the last 8760 hours.  Physical Exam:    Today's Vitals   01/12/23 0828  BP: 128/80  Pulse: (!) 57  Resp: 16  SpO2: 93%  Weight: 241 lb (109.3 kg)  Height: 6\' 4"  (1.93 m)   Body mass index is 29.34 kg/m. Wt Readings from Last 3 Encounters:  01/12/23 241 lb (109.3 kg)  07/10/22 242 lb (109.8 kg)  05/30/22 245 lb (111.1 kg)    Physical Exam   Impression & Recommendation(s):  Impression:   ICD-10-CM  1. Calcification of native coronary artery  I25.10 EKG 12-Lead   I25.84     2. Atherosclerosis of aorta (HCC)  I70.0     3. Abdominal aortic aneurysm (AAA) without rupture, unspecified part (HCC)  I71.40     4. Dyspnea on exertion  R06.09     5. Primary hypertension  I10     6. Mixed hyperlipidemia  E78.2     7. Former smoker  Z87.891        Recommendation(s):  Calcification of native coronary artery Atherosclerosis of aorta (HCC) Continue aspirin and statin therapy. Most recent lipid profile from July 2024 notes LDL at goal. Has undergone appropriate ischemic workup in the past. EKG is nonischemic. Reemphasized the importance of secondary prevention with focus on improving her modifiable cardiovascular risk factors such as glycemic control, lipid management, blood pressure control, weight loss.  Abdominal aortic aneurysm (AAA) without rupture, unspecified part (HCC) Last duplex in September 2023. Recommended 3-year follow-up study. Continue antiplatelet therapy and lipid-lowering agents.  Dyspnea on exertion Multifactorial but stable Reemphasized importance of complete cessation of smoking and to be treated for his underlying COPD/emphysema. Reemphasized the importance of using using his oxygen as directed. Monitor for now  Primary hypertension Office blood pressures are very well-controlled. Continue bisoprolol/hydrochlorothiazide 5/6.25 mg p.o. daily. Continue Lasix 20 mg p.o. daily.  Mixed hyperlipidemia Currently on rosuvastatin  and Zetia.   He denies myalgia or other side effects. Most recent lipids dated July 2024, independently reviewed as noted above.  LDL at goal   Orders Placed:  Orders Placed This Encounter  Procedures   EKG 12-Lead    Final Medication List:   No orders of the defined types were placed in this encounter.   Medications Discontinued During This Encounter  Medication Reason   omeprazole (PRILOSEC) 40 MG capsule Patient Preference     Current Outpatient Medications:    albuterol (PROVENTIL HFA;VENTOLIN HFA) 108 (90 BASE) MCG/ACT inhaler, Inhale 1-2 puffs into the lungs every 6 (six) hours as needed for wheezing or shortness of breath., Disp: 18 g, Rfl: 2   aspirin 81 MG tablet, Take 81 mg by mouth daily., Disp: , Rfl:    benzonatate (TESSALON) 200 MG capsule, Take 200 mg by mouth 2 (two) times daily as needed., Disp: , Rfl:    bisoprolol-hydrochlorothiazide (ZIAC) 5-6.25 MG per tablet, Take 1 tablet every morning for BP, Disp: 90 tablet, Rfl: 1   clonazePAM (KLONOPIN) 2 MG tablet, TAKE 1 TABLET BY MOUTH 3 TIMES DAILY (Patient taking differently: Take 1 mg by mouth as needed.), Disp: 90 tablet, Rfl: 0   CRESTOR 40 MG tablet, TAKE 1 TABLET BY MOUTH DAILY, Disp: 30 tablet, Rfl: 2   ezetimibe (ZETIA) 10 MG tablet, Take 1 tablet (10 mg total) by mouth daily., Disp: 90 tablet, Rfl: 3   FLUoxetine (PROZAC) 20 MG capsule, Take 20 mg by mouth daily., Disp: , Rfl:    fluticasone (FLONASE) 50 MCG/ACT nasal spray, Place 2 sprays into both nostrils daily., Disp: , Rfl:    Fluticasone-Umeclidin-Vilant (TRELEGY ELLIPTA) 100-62.5-25 MCG/ACT AEPB, Inhale 1 puff into the lungs daily at 2 PM., Disp: , Rfl:    furosemide (LASIX) 20 MG tablet, Take 1 tablet by mouth daily., Disp: , Rfl:    ipratropium-albuterol (DUONEB) 0.5-2.5 (3) MG/3ML SOLN, Inhale 3 mLs into the lungs every 2 (two) hours as needed., Disp: , Rfl:    tamsulosin (FLOMAX) 0.4 MG CAPS capsule, Take 0.4 mg by mouth daily., Disp: , Rfl:  triamcinolone cream (KENALOG) 0.1 %, Apply topically 2 (two) times daily., Disp: , Rfl:   Consent:   N/A  Disposition:   1 year follow-up sooner if needed Patient may be asked to follow-up sooner based on the results of the above-mentioned testing.  His questions and concerns were addressed to his satisfaction. He voices understanding of the recommendations provided during this encounter.    Signed, Tessa Lerner, DO, [facility]  [facility]  [address] [place], [place] [address] 01/12/2023 9:16 AM

## 2024-01-11 ENCOUNTER — Telehealth: Payer: Self-pay | Admitting: Cardiology

## 2024-01-11 NOTE — Telephone Encounter (Signed)
 Pt c/o swelling: STAT is pt has developed SOB within 24 hours  How much weight have you gained and in what time span?  Wife assumes patient has gained weight but she doesn't know what his current weight is.  If swelling, where is the swelling located?  Ankles   Are you currently taking a fluid pill?  Yes, 2 Lasix. Wife unable to confirm how many MG  Are you currently SOB? Per wife SOB has been going on about 1 month. Patient developed a cough at night using oxygen. He saw pulmonologist who advised to follow up with cardiology since coughing occurs while laying flat. May indicate fluid around heart. Please advise.  Do you have a log of your daily weights (if so, list)?   Have you gained 3 pounds in a day or 5 pounds in a week?   Have you traveled recently?

## 2024-01-11 NOTE — Telephone Encounter (Signed)
 Called and spke to pt and pt reports he outside and  pt denies shortness of breath. Per patient today weigh is 260lb. Pt not able to tell previous. Per pt he does not weigh self daily. Per pt both legs are swollen.Per pt he put on compression socks on today. Made pt aware that he should put compression stockings on in am and take off at bedtime. Per pt he has been taking 2 lasix every day per his pulmonologist. Advise pt to elevate legs throughout the day and watch sodium content. Per pt yesterday he consumed he consumed sandwich deli ham and turkey and baked spaghetti. Appt made 12/15 with Dr. Michele. ED precautions reviewed with pt. Understanding verbalized.

## 2024-01-18 ENCOUNTER — Ambulatory Visit: Admitting: Cardiology

## 2024-01-18 ENCOUNTER — Encounter: Payer: Self-pay | Admitting: Cardiology

## 2024-01-18 VITALS — BP 131/86 | HR 56 | Resp 16 | Ht 76.0 in | Wt 236.0 lb

## 2024-01-18 DIAGNOSIS — R0609 Other forms of dyspnea: Secondary | ICD-10-CM

## 2024-01-18 DIAGNOSIS — I2510 Atherosclerotic heart disease of native coronary artery without angina pectoris: Secondary | ICD-10-CM | POA: Diagnosis present

## 2024-01-18 DIAGNOSIS — I714 Abdominal aortic aneurysm, without rupture, unspecified: Secondary | ICD-10-CM | POA: Insufficient documentation

## 2024-01-18 DIAGNOSIS — I700 Atherosclerosis of aorta: Secondary | ICD-10-CM | POA: Diagnosis not present

## 2024-01-18 DIAGNOSIS — I77810 Thoracic aortic ectasia: Secondary | ICD-10-CM | POA: Diagnosis not present

## 2024-01-18 DIAGNOSIS — I2584 Coronary atherosclerosis due to calcified coronary lesion: Secondary | ICD-10-CM

## 2024-01-18 DIAGNOSIS — Z87891 Personal history of nicotine dependence: Secondary | ICD-10-CM | POA: Diagnosis present

## 2024-01-18 DIAGNOSIS — E782 Mixed hyperlipidemia: Secondary | ICD-10-CM

## 2024-01-18 DIAGNOSIS — I10 Essential (primary) hypertension: Secondary | ICD-10-CM | POA: Diagnosis present

## 2024-01-18 MED ORDER — EZETIMIBE 10 MG PO TABS: 10.0000 mg | ORAL_TABLET | Freq: Every day | ORAL | 3 refills | Status: AC

## 2024-01-18 NOTE — Progress Notes (Signed)
 Cardiology Office Note:  .   Date:  01/18/2024  ID:  Victor Pham, DOB 1950-02-23, MRN [id number] PCP:  Regino Slater, MD  Former Cardiology Providers: None [facility] Providers Cardiologist:  Madonna Large, DO , [facility] (established care April 2024) Electrophysiologist:  None  Click to update primary MD,subspecialty MD or APP then REFRESH:1}    Chief Complaint  Patient presents with   Calcification of native coronary artery   Follow-up    History of Present Illness: .   Victor Pham is a 73 y.o. Caucasian male whose past medical history and cardiovascular risk factors includes: Coronary artery calcification, aortic atherosclerosis, former smoker ( quit 11/2021, atleast 1.5 ppd for 55 years), chronic hypoxia on supplemental oxygen, hx of alcohol abuse (quit 2021), HTN, HLD, Prediabetic, AAA (3.4cm as of 10/22/2021), anxiety / depression.   Patient was referred to the practice for evaluation of coronary artery calcification and aortic atherosclerosis.  He underwent CT of the chest for lung cancer screening in the past and was noted to have mild coronary artery calcification and aortic atherosclerosis.  He underwent appropriate cardiovascular workup.  He continue to follow-up with pulmonary medicine and was placed on nasal cannula oxygen for chronic hypoxia.  Patient presents today for 1 year follow-up visit.  Patient is not accompanied by his wife at today's visit. In October 2025 when he followed up with his pulmonologist he was having predominantly coughing episodes and on physical examination was noted to have swelling and his Lasix was increased from 20 mg p.o. daily to 40 mg p.o. daily.  BNP was checked at that time which was within normal limits independently reviewed in Care Everywhere he has not had any follow-up labs and today he complains of having cramps in bilateral lower extremities.  He has no significant swelling.  Warm to touch bilaterally and some skin indentation at  the sock line. He denies anginal chest pain. Functional capacity remains relatively stable Patient informed that he no longer follows up with Dr. Regino and he sees someone in [place]. For reasons unknown he stopped taking aspirin as well as Zetia .   Review of Systems: .   Review of Systems  Cardiovascular:  Negative for chest pain, claudication, irregular heartbeat, leg swelling, near-syncope, orthopnea, palpitations, paroxysmal nocturnal dyspnea and syncope.  Respiratory:  Positive for shortness of breath (Chronic and stable).   Hematologic/Lymphatic: Negative for bleeding problem.  Musculoskeletal:  Positive for muscle cramps (Bilateral calf).    Studies Reviewed:   EKG: EKG Interpretation Date/Time:  Monday January 18 2024 15:09:10 EST Ventricular Rate:  52 PR Interval:  212 QRS Duration:  92 QT Interval:  440 QTC Calculation: 409 R Axis:   40  Text Interpretation: Sinus bradycardia with 1st degree A-V block Cannot rule out Anterior infarct , age undetermined When compared with ECG of 12-Jan-2023 08:32, No significant change was found Confirmed by Large Madonna [phone]) on 01/18/2024 3:15:39 PM  Echocardiogram: 07/03/2022: Left ventricle cavity is normal in size. Normal left ventricular wall thickness. Mild global hypokinesis. LVEF 45-50%. Doppler evidence of grade I (impaired) diastolic dysfunction, normal LAP. Personally reviewed and LVEF 50-55%.  The aortic root is mildly dilated at 4.0 cm at sinus of Valsalva. Mildly dilated proximal ascending aorta at 3.9 cm. No significant valvular abnormality. No evidence of pulmonary hypertension.     Stress Testing: Regadenoson  (with Mod Bruce protocol) Nuclear stress test 06/16/2022 Myocardial perfusion is normal. Diaphragmatic attenuation is present. Overall LV systolic function is normal without regional  wall motion abnormalities. Stress LV EF: 50%. Low risk study. Nondiagnostic ECG stress. The heart rate response was consistent  with Regadenoson . The blood pressure response was physiologic. No previous exam available for comparison.   Ultrasound of the abdomen AAA screening: 10/22/2021 Abdominal aortic aneurysm measuring maximum 3.4 cm. Recommend  follow-up ultrasound every 3 years. This recommendation follows ACR consensus guidelines: Tech Paper of the ACR Incidental Findings Committee II on Vascular Findings. J Am Coll Radiol [phone].  RADIOLOGY: CT chest lung cancer screening:  05/08/2022 1. Lung-RADS 2, benign appearance or behavior. Continue annual screening with low-dose chest CT without contrast in 12 months. 2. Pulmonary artery enlargement suggests pulmonary arterial hypertension. 3. Borderline to mild prevascular adenopathy, favored to be reactive. Recommend attention on follow-up. 4. Aortic Atherosclerosis (ICD10-I70.0) and Emphysema (ICD10-J43.9). Coronary artery atherosclerosis.  Risk Assessment/Calculations:   NA   Labs:       Latest Ref Rng & Units 12/22/2019    7:52 AM 10/15/2016    1:33 AM 06/23/2014    9:46 AM  CBC  WBC 4.0 - 10.5 K/uL 7.0  5.6  6.3   Hemoglobin 13.0 - 17.0 g/dL 87.4  85.3  84.1   Hematocrit 39.0 - 52.0 % 38.5  43.9  46.2   Platelets 150 - 400 K/uL 243  166  242        Latest Ref Rng & Units 08/29/2022    8:53 AM 05/30/2022   12:36 PM 12/22/2019    7:52 AM  BMP  Glucose 70 - 99 mg/dL 896  898  874   BUN 8 - 27 mg/dL 13  12  16    Creatinine 0.76 - 1.27 mg/dL 9.08  9.28  9.32   BUN/Creat Ratio 10 - 24 14  17     Sodium 134 - 144 mmol/L 138  138  135   Potassium 3.5 - 5.2 mmol/L 4.5  4.6  4.5   Chloride 96 - 106 mmol/L 103  102  101   CO2 20 - 29 mmol/L 22  23  26    Calcium  8.6 - 10.2 mg/dL 9.4  9.5  9.2       Latest Ref Rng & Units 08/29/2022    8:53 AM 05/30/2022   12:36 PM 12/22/2019    7:52 AM  CMP  Glucose 70 - 99 mg/dL 896  898  874   BUN 8 - 27 mg/dL 13  12  16    Creatinine 0.76 - 1.27 mg/dL 9.08  9.28  9.32   Sodium 134 - 144 mmol/L 138  138   135   Potassium 3.5 - 5.2 mmol/L 4.5  4.6  4.5   Chloride 96 - 106 mmol/L 103  102  101   CO2 20 - 29 mmol/L 22  23  26    Calcium  8.6 - 10.2 mg/dL 9.4  9.5  9.2   Total Protein 6.0 - 8.5 g/dL 6.7  6.9  6.2   Total Bilirubin 0.0 - 1.2 mg/dL 0.4  0.2  0.8   Alkaline Phos 44 - 121 IU/L 99  85  59   AST 0 - 40 IU/L 18  14  18    ALT 0 - 44 IU/L 26  16  21      Lab Results  Component Value Date   CHOL 111 08/29/2022   HDL 44 08/29/2022   LDLCALC 50 08/29/2022   LDLDIRECT 52 08/29/2022   TRIG 85 08/29/2022   CHOLHDL 3.0 06/23/2014   No results for input(s): LIPOA  in the last 8760 hours. No components found for: NTPROBNP No results for input(s): PROBNP in the last 8760 hours. No results for input(s): TSH in the last 8760 hours.  Physical Exam:    Today's Vitals   01/18/24 1512  BP: 131/86  Pulse: (!) 56  Resp: 16  SpO2: 93%  Weight: 236 lb (107 kg)  Height: 6' 4 (1.93 m)   Body mass index is 28.73 kg/m. Wt Readings from Last 3 Encounters:  01/18/24 236 lb (107 kg)  01/12/23 241 lb (109.3 kg)  07/10/22 242 lb (109.8 kg)    Physical Exam  Constitutional: No distress.  hemodynamically stable  Neck: No JVD present.  Cardiovascular: Normal rate, regular rhythm, S1 normal and S2 normal. Exam reveals no gallop, no S3 and no S4.  No murmur heard. Pulmonary/Chest: Effort normal and breath sounds normal. No stridor. He has no wheezes. He has no rales.  Musculoskeletal:        General: Edema (Trace bilateral) present.     Cervical back: Neck supple.  Skin: Skin is warm.     Impression & Recommendation(s):  Impression:   ICD-10-CM   1. Calcification of native coronary artery  I25.10 EKG 12-Lead   I25.84 ECHOCARDIOGRAM COMPLETE    Pro b natriuretic peptide    Basic Metabolic Panel (BMET)    Magnesium    CANCELED: US  AORTA    2. Atherosclerosis of aorta  I70.0 ECHOCARDIOGRAM COMPLETE    Pro b natriuretic peptide    Basic Metabolic Panel (BMET)    Magnesium     CANCELED: US  AORTA    3. Aortic root dilatation  I77.810 ECHOCARDIOGRAM COMPLETE    Pro b natriuretic peptide    Basic Metabolic Panel (BMET)    Magnesium    CANCELED: US  AORTA    4. Abdominal aortic aneurysm (AAA) without rupture, unspecified part  I71.40 ECHOCARDIOGRAM COMPLETE    Pro b natriuretic peptide    Basic Metabolic Panel (BMET)    Magnesium    VAS US  AAA DUPLEX    CANCELED: US  AORTA    5. Dyspnea on exertion  R06.09 ECHOCARDIOGRAM COMPLETE    Pro b natriuretic peptide    Basic Metabolic Panel (BMET)    Magnesium    CANCELED: US  AORTA    6. Primary hypertension  I10 ECHOCARDIOGRAM COMPLETE    Pro b natriuretic peptide    Basic Metabolic Panel (BMET)    Magnesium    CANCELED: US  AORTA    7. Mixed hyperlipidemia  E78.2 ECHOCARDIOGRAM COMPLETE    Pro b natriuretic peptide    Basic Metabolic Panel (BMET)    Magnesium    CANCELED: US  AORTA    8. Former smoker  Z87.891 ECHOCARDIOGRAM COMPLETE    Pro b natriuretic peptide    Basic Metabolic Panel (BMET)    Magnesium    CANCELED: US  AORTA       Recommendation(s):  Calcification of native coronary artery Atherosclerosis of aorta (HCC) Restart aspirin 81 mg p.o. daily. Restarted Zetia  10 mg p.o. daily. Continue rosuvastatin  40 mg p.o. nightly No additional diagnostic testing warranted at this time and asymptomatic male.  Aortic root dilatation: Prior echocardiograms have noted dilated aortic root. Will schedule repeat echocardiogram to reevaluate dimensions.  Abdominal aortic aneurysm (AAA) without rupture, unspecified part (HCC) Last duplex in September 2023. Recommended 3-year follow-up study. Continue antiplatelet therapy and lipid-lowering agents. Please he does not follow with a seen primary care provider and does not recall who he sees currently  I will take the liberty to order an ultrasound of the aorta to follow-up on his history of AAA.  He will be scheduled for September 2026.  Dyspnea on  exertion Multifactorial Reemphasized importance of complete cessation of smoking  Reemphasized the importance of using using his oxygen as directed. Continues to follow with pulmonary medicine. Not in overt heart failure. Echo will be ordered to evaluate for structural heart disease and left ventricular systolic function. When he saw pulmonary in October 2025 there was concern that he was retaining fluid.  BNP was within normal limits.  Lasix was increased from 20 mg p.o. daily to 40 mg p.o. daily with no follow-up labs.  Today he complains of cramping in bilateral lower extremity swelling and no significant volume overload on physical examination.  I will order a BMP, NT proBNP, and magnesium level to reevaluate. Patient is advised to check his blood pressures and daily weights. He will reduce his Lasix to 20 mg p.o. daily However, he is advised to take additional 20 mg dose if his weight increases by 1 pound over 24 hours or 3 pounds over a week.  Primary hypertension Office blood pressures are very well-controlled. Continue bisoprolol /hydrochlorothiazide  5/6.25 mg p.o. daily. Continue Lasix 20 mg p.o. daily.  Mixed hyperlipidemia Continue rosuvastatin . Restart Zetia   He denies myalgia or other side effects. Most recent lipids dated July 2024, independently reviewed as noted above.  LDL at goal   Orders Placed:  Orders Placed This Encounter  Procedures   Pro b natriuretic peptide   Basic Metabolic Panel (BMET)   Magnesium   EKG 12-Lead   ECHOCARDIOGRAM COMPLETE    Standing Status:   Future    Expected Date:   02/18/2024    Expiration Date:   01/17/2025    Where [facility] this test [facility] performed[facility]    Does the patient weigh less than or greater than 250 lbs?:   Patient weighs less than 250 lbs    Perflutren DEFINITY (image enhancing agent) should be administered unless hypersensitivity or allergy exist:   Administer Perflutren    Reason for exam-Echo:    Other-Full Diagnosis List    Full ICD-10/Reason for Exam:   Aortic root dilation [[id number]]    Final Medication List:    Meds ordered this encounter  Medications   ezetimibe  (ZETIA ) 10 MG tablet    Sig: Take 1 tablet (10 mg total) by mouth daily.    Dispense:  90 tablet    Refill:  3    Medications Discontinued During This Encounter  Medication Reason   benzonatate (TESSALON) 200 MG capsule Completed Course   ipratropium-albuterol  (DUONEB) 0.5-2.5 (3) MG/3ML SOLN Change in therapy   ezetimibe  (ZETIA ) 10 MG tablet Patient Preference     Current Outpatient Medications:    albuterol  (PROVENTIL  HFA;VENTOLIN  HFA) 108 (90 BASE) MCG/ACT inhaler, Inhale 1-2 puffs into the lungs every 6 (six) hours as needed for wheezing or shortness of breath., Disp: 18 g, Rfl: 2   bisoprolol -hydrochlorothiazide  (ZIAC ) 5-6.25 MG per tablet, Take 1 tablet every morning for BP, Disp: 90 tablet, Rfl: 1   clonazePAM  (KLONOPIN ) 2 MG tablet, TAKE 1 TABLET BY MOUTH 3 TIMES DAILY (Patient taking differently: Take 1 mg by mouth as needed.), Disp: 90 tablet, Rfl: 0   CRESTOR  40 MG tablet, TAKE 1 TABLET BY MOUTH DAILY, Disp: 30 tablet, Rfl: 2   ezetimibe  (ZETIA ) 10 MG tablet, Take 1 tablet (10 mg total) by mouth daily., Disp:  90 tablet, Rfl: 3   FLUoxetine (PROZAC) 20 MG capsule, Take 20 mg by mouth daily., Disp: , Rfl:    fluticasone (FLONASE) 50 MCG/ACT nasal spray, Place 2 sprays into both nostrils daily., Disp: , Rfl:    Fluticasone-Umeclidin-Vilant (TRELEGY ELLIPTA) 100-62.5-25 MCG/ACT AEPB, Inhale 1 puff into the lungs daily at 2 PM., Disp: , Rfl:    furosemide (LASIX) 20 MG tablet, Take 1 tablet by mouth daily., Disp: , Rfl:    tamsulosin (FLOMAX) 0.4 MG CAPS capsule, Take 0.4 mg by mouth daily., Disp: , Rfl:    triamcinolone cream (KENALOG) 0.1 %, Apply topically 2 (two) times daily., Disp: , Rfl:    aspirin 81 MG tablet, Take 81 mg by mouth daily. (Patient not taking: Reported on 01/18/2024), Disp: , Rfl:    Consent:   N/A  Disposition:   1 year follow-up sooner if needed  His questions and concerns were addressed to his satisfaction. He voices understanding of the recommendations provided during this encounter.    Signed, Madonna Michele HAS, [facility]  A Division of [facility] [address] [place], [place] [address]  01/18/2024 4:15 PM

## 2024-01-18 NOTE — Patient Instructions (Addendum)
 Medication Instructions:  START Aspirin 81 mg. Take one (1) tablet by mouth once daily.  START Ezetimibe  (Zetia ) 10 mg. Take one (1) tablet by mouth once daily.  DECREASE  Furosemide (Lasix) to 20 mg. Take one (1) tablet by mouth once daily. Take an additional one (1) tablet for weight gain of one pound overnight or 3 pounds in one (1) week. *If you need a refill on your cardiac medications before your next appointment, please call your pharmacy*  Lab Work: NT PRO-BNP, BMP, Mag. Today  If you have labs (blood work) drawn today and your tests are completely normal, you will receive your results only by: MyChart Message (if you have MyChart) OR A paper copy in the mail If you have any lab test that is abnormal or we need to change your treatment, we will call you to review the results.  Testing/Procedures: ECHOCARDIOGRAM  AORTIC ULTRASOUND in September 2026  Follow-Up: At [facility], you and your health needs are our priority.  As part of our continuing mission to provide you with exceptional heart care, our providers are all part of one team.  This team includes your primary Cardiologist (physician) and Advanced Practice Providers or APPs (Physician Assistants and Nurse Practitioners) who all work together to provide you with the care you need, when you need it.  Your next appointment:   11 month(s)  Provider:   Madonna Large, DO    We recommend signing up for the patient portal called MyChart.  Sign up information is provided on this After Visit Summary.  MyChart is used to connect with patients for Virtual Visits (Telemedicine).  Patients are able to view lab/test results, encounter notes, upcoming appointments, etc.  Non-urgent messages can be sent to your provider as well.   To learn more about what you can do with MyChart, go to [email or web address].   Other Instructions Your physician has requested that you have an echocardiogram. Echocardiography is a painless  test that uses sound waves to create images of your heart. It provides your doctor with information about the size and shape of your heart and how well your hearts chambers and valves are working. This procedure takes approximately one hour. There are no restrictions for this procedure. Please do NOT wear cologne, perfume, aftershave, or lotions (deodorant is allowed). Please arrive 15 minutes prior to your appointment time.  Please note: We ask at that you not bring children with you during ultrasound (echo/ vascular) testing. Due to room size and safety concerns, children are not allowed in the ultrasound rooms during exams. Our front office staff cannot provide observation of children in our lobby area while testing is being conducted. An adult accompanying a patient to their appointment will only be allowed in the ultrasound room at the discretion of the ultrasound technician under special circumstances. We apologize for any inconvenience.   Your physician has requested that you have an abdominal aorta duplex. During this test, an ultrasound is used to evaluate the aorta. Allow 30 minutes for this exam. Do not eat after midnight the day before and avoid carbonated beverages.  Please note: We ask at that you not bring children with you during ultrasound (echo/ vascular) testing. Due to room size and safety concerns, children are not allowed in the ultrasound rooms during exams. Our front office staff cannot provide observation of children in our lobby area while testing is being conducted. An adult accompanying a patient to their appointment will only be allowed in the ultrasound  room at the discretion of the ultrasound technician under special circumstances. We apologize for any inconvenience.

## 2024-01-19 ENCOUNTER — Telehealth: Payer: Self-pay | Admitting: Cardiology

## 2024-01-19 ENCOUNTER — Ambulatory Visit: Payer: Self-pay | Admitting: Cardiology

## 2024-01-19 LAB — BASIC METABOLIC PANEL WITH GFR
BUN/Creatinine Ratio: 10 (ref 10–24)
BUN: 8 mg/dL (ref 8–27)
CO2: 24 mmol/L (ref 20–29)
Calcium: 9.7 mg/dL (ref 8.6–10.2)
Chloride: 101 mmol/L (ref 96–106)
Creatinine, Ser: 0.82 mg/dL (ref 0.76–1.27)
Glucose: 93 mg/dL (ref 70–99)
Potassium: 4.6 mmol/L (ref 3.5–5.2)
Sodium: 139 mmol/L (ref 134–144)
eGFR: 94 mL/min/1.73 (ref >59)

## 2024-01-19 LAB — PRO B NATRIURETIC PEPTIDE: NT-Pro BNP: 307 pg/mL (ref 0–376)

## 2024-01-19 LAB — MAGNESIUM: Magnesium: 2.4 mg/dL — ABNORMAL HIGH (ref 1.6–2.3)

## 2024-01-19 NOTE — Telephone Encounter (Signed)
 Pt wife called in stating pt's PCP in his chart was incorrect. I changed it to correct PCP, Lauraine Sic, NP. She also states his weight is over 250lbs, not 236lbs and asked if this can be changed.

## 2024-03-01 ENCOUNTER — Ambulatory Visit (HOSPITAL_COMMUNITY)
Admission: RE | Admit: 2024-03-01 | Discharge: 2024-03-01 | Disposition: A | Discharge status: Home / Self Care | Source: Ambulatory Visit

## 2024-03-01 DIAGNOSIS — I700 Atherosclerosis of aorta: Secondary | ICD-10-CM | POA: Diagnosis not present

## 2024-03-01 DIAGNOSIS — I2584 Coronary atherosclerosis due to calcified coronary lesion: Secondary | ICD-10-CM | POA: Diagnosis present

## 2024-03-01 DIAGNOSIS — I714 Abdominal aortic aneurysm, without rupture, unspecified: Secondary | ICD-10-CM | POA: Insufficient documentation

## 2024-03-01 DIAGNOSIS — I2510 Atherosclerotic heart disease of native coronary artery without angina pectoris: Secondary | ICD-10-CM | POA: Insufficient documentation

## 2024-03-01 DIAGNOSIS — I10 Essential (primary) hypertension: Secondary | ICD-10-CM | POA: Diagnosis not present

## 2024-03-01 DIAGNOSIS — R0609 Other forms of dyspnea: Secondary | ICD-10-CM | POA: Insufficient documentation

## 2024-03-01 DIAGNOSIS — Z87891 Personal history of nicotine dependence: Secondary | ICD-10-CM | POA: Insufficient documentation

## 2024-03-01 DIAGNOSIS — I77810 Thoracic aortic ectasia: Secondary | ICD-10-CM | POA: Insufficient documentation

## 2024-03-01 DIAGNOSIS — E782 Mixed hyperlipidemia: Secondary | ICD-10-CM | POA: Insufficient documentation

## 2024-03-01 LAB — ECHOCARDIOGRAM COMPLETE
Area-P 1/2: 3.85 cm2
S' Lateral: 3.4 cm

## 2024-10-18 ENCOUNTER — Ambulatory Visit (HOSPITAL_COMMUNITY)
